# Patient Record
Sex: Male | Born: 1975 | Hispanic: No | Marital: Married | Smoking: Never smoker
Health system: Southern US, Community
[De-identification: ages and names within clinical notes are randomized; demographics above are authoritative.]

---
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
## 2004-03-12 HISTORY — PX: REFRACTIVE SURGERY: SHX103

## 2019-02-03 ENCOUNTER — Other Ambulatory Visit: Payer: Self-pay

## 2019-02-03 DIAGNOSIS — Z20822 Contact with and (suspected) exposure to covid-19: Secondary | ICD-10-CM

## 2019-02-04 LAB — NOVEL CORONAVIRUS, NAA: SARS-CoV-2, NAA: NOT DETECTED

## 2019-04-17 ENCOUNTER — Ambulatory Visit: Payer: BC Managed Care – PPO | Attending: Internal Medicine

## 2019-04-17 DIAGNOSIS — U071 COVID-19: Secondary | ICD-10-CM | POA: Insufficient documentation

## 2019-04-17 DIAGNOSIS — Z8616 Personal history of COVID-19: Secondary | ICD-10-CM

## 2019-04-17 DIAGNOSIS — Z20822 Contact with and (suspected) exposure to covid-19: Secondary | ICD-10-CM

## 2019-04-18 LAB — NOVEL CORONAVIRUS, NAA: SARS-CoV-2, NAA: DETECTED — AB

## 2019-07-12 ENCOUNTER — Encounter (HOSPITAL_COMMUNITY): Payer: Self-pay | Admitting: Emergency Medicine

## 2019-07-12 ENCOUNTER — Other Ambulatory Visit: Payer: Self-pay

## 2019-07-12 ENCOUNTER — Inpatient Hospital Stay (HOSPITAL_COMMUNITY)
Admission: EM | Admit: 2019-07-12 | Discharge: 2019-07-18 | DRG: 342 | Disposition: A | Discharge status: Home / Self Care | Payer: BC Managed Care – PPO | Attending: General Surgery | Admitting: General Surgery

## 2019-07-12 ENCOUNTER — Emergency Department (HOSPITAL_COMMUNITY): Payer: BC Managed Care – PPO

## 2019-07-12 DIAGNOSIS — M109 Gout, unspecified: Secondary | ICD-10-CM

## 2019-07-12 DIAGNOSIS — R3916 Straining to void: Secondary | ICD-10-CM | POA: Diagnosis present

## 2019-07-12 DIAGNOSIS — E669 Obesity, unspecified: Secondary | ICD-10-CM | POA: Diagnosis present

## 2019-07-12 DIAGNOSIS — E876 Hypokalemia: Secondary | ICD-10-CM | POA: Diagnosis present

## 2019-07-12 DIAGNOSIS — K352 Acute appendicitis with generalized peritonitis, without abscess: Principal | ICD-10-CM | POA: Diagnosis present

## 2019-07-12 DIAGNOSIS — Z6830 Body mass index (BMI) 30.0-30.9, adult: Secondary | ICD-10-CM

## 2019-07-12 DIAGNOSIS — F419 Anxiety disorder, unspecified: Secondary | ICD-10-CM | POA: Diagnosis present

## 2019-07-12 DIAGNOSIS — K625 Hemorrhage of anus and rectum: Secondary | ICD-10-CM | POA: Diagnosis present

## 2019-07-12 DIAGNOSIS — R300 Dysuria: Secondary | ICD-10-CM

## 2019-07-12 DIAGNOSIS — K3580 Unspecified acute appendicitis: Principal | ICD-10-CM

## 2019-07-12 DIAGNOSIS — K429 Umbilical hernia without obstruction or gangrene: Secondary | ICD-10-CM | POA: Diagnosis present

## 2019-07-12 DIAGNOSIS — N179 Acute kidney failure, unspecified: Secondary | ICD-10-CM | POA: Diagnosis present

## 2019-07-12 DIAGNOSIS — Z8616 Personal history of COVID-19: Secondary | ICD-10-CM

## 2019-07-12 DIAGNOSIS — D62 Acute posthemorrhagic anemia: Secondary | ICD-10-CM | POA: Diagnosis not present

## 2019-07-12 DIAGNOSIS — A0472 Enterocolitis due to Clostridium difficile, not specified as recurrent: Secondary | ICD-10-CM | POA: Diagnosis present

## 2019-07-12 DIAGNOSIS — R7989 Other specified abnormal findings of blood chemistry: Secondary | ICD-10-CM

## 2019-07-12 LAB — COMPREHENSIVE METABOLIC PANEL
ALT: 20 U/L (ref 0–44)
AST: 23 U/L (ref 15–41)
Albumin: 4.3 g/dL (ref 3.5–5.0)
Alkaline Phosphatase: 52 U/L (ref 38–126)
Anion gap: 7 (ref 5–15)
BUN: 12 mg/dL (ref 6–20)
CO2: 25 mmol/L (ref 22–32)
Calcium: 9.1 mg/dL (ref 8.9–10.3)
Chloride: 105 mmol/L (ref 98–111)
Creatinine, Ser: 1.44 mg/dL — ABNORMAL HIGH (ref 0.61–1.24)
GFR calc Af Amer: >60 mL/min (ref >60)
GFR calc non Af Amer: 59 mL/min — ABNORMAL LOW (ref >60)
Glucose, Bld: 118 mg/dL — ABNORMAL HIGH (ref 70–99)
Potassium: 3.3 mmol/L — ABNORMAL LOW (ref 3.5–5.1)
Sodium: 137 mmol/L (ref 135–145)
Total Bilirubin: 1 mg/dL (ref 0.3–1.2)
Total Protein: 7.4 g/dL (ref 6.5–8.1)

## 2019-07-12 LAB — CBC
HCT: 46.4 % (ref 39.0–52.0)
Hemoglobin: 15.8 g/dL (ref 13.0–17.0)
MCH: 29.3 pg (ref 26.0–34.0)
MCHC: 34.1 g/dL (ref 30.0–36.0)
MCV: 85.9 fL (ref 80.0–100.0)
Platelets: 290 10*3/uL (ref 150–400)
RBC: 5.4 MIL/uL (ref 4.22–5.81)
RDW: 12.8 % (ref 11.5–15.5)
WBC: 16.2 10*3/uL — ABNORMAL HIGH (ref 4.0–10.5)
nRBC: 0 % (ref 0.0–0.2)

## 2019-07-12 LAB — LIPASE, BLOOD: Lipase: 40 U/L (ref 11–51)

## 2019-07-12 LAB — POC OCCULT BLOOD, ED: Fecal Occult Bld: POSITIVE — AB

## 2019-07-12 MED ORDER — SODIUM CHLORIDE (PF) 0.9 % IJ SOLN
INTRAMUSCULAR | Status: AC
  Filled 2019-07-12: qty 50

## 2019-07-12 MED ORDER — ONDANSETRON HCL 4 MG/2ML IJ SOLN
4.0000 mg | Freq: Once | INTRAMUSCULAR | Status: AC
  Administered 2019-07-12: 4 mg via INTRAVENOUS
  Filled 2019-07-12: qty 2

## 2019-07-12 MED ORDER — MORPHINE SULFATE (PF) 4 MG/ML IV SOLN
4.0000 mg | Freq: Once | INTRAVENOUS | Status: AC
  Administered 2019-07-12: 4 mg via INTRAVENOUS
  Filled 2019-07-12: qty 1

## 2019-07-12 MED ORDER — SODIUM CHLORIDE 0.9 % IV SOLN: 2.0000 g | Freq: Once | INTRAVENOUS | Status: DC

## 2019-07-12 MED ORDER — SODIUM CHLORIDE 0.9% FLUSH
3.0000 mL | Freq: Once | INTRAVENOUS | Status: AC
  Administered 2019-07-12: 3 mL via INTRAVENOUS

## 2019-07-12 MED ORDER — IOHEXOL 300 MG/ML  SOLN
100.0000 mL | Freq: Once | INTRAMUSCULAR | Status: AC | PRN
  Administered 2019-07-12: 100 mL via INTRAVENOUS

## 2019-07-12 MED ORDER — METRONIDAZOLE IN NACL 5-0.79 MG/ML-% IV SOLN
500.0000 mg | Freq: Once | INTRAVENOUS | Status: DC
  Filled 2019-07-12: qty 100

## 2019-07-12 NOTE — ED Triage Notes (Signed)
Patient is complaining of penis pain from straining due to constipation. Patient is complaining of right groin pain and abdominal pain. All of this started around 2 am.

## 2019-07-12 NOTE — ED Provider Notes (Signed)
[facility]-EMERGENCY DEPT Provider Note   CSN: [id number] Arrival date & time: 07/12/19  2141     History Chief Complaint  Patient presents with  . Abdominal Pain  . Groin Pain  . Penis Pain    Andre Green is a 44 y.o. male who presents for evaluation of abdominal pain that began this morning at about 2 AM.  He states he woke up with diffuse abdominal pain/pressure.  He states that he tried to have a bowel movement but states that it did not help.  He reports that throughout the day, he continued to have pain.  He reported that he had some small episodes of nausea but did not actually vomit.  He was able to eat some small amount of rice.  Patient reports that as he has a day went through, he felt like his abdomen is getting more bloated and more painful.  He states he has not been passing any gas over the last day.  He states he tried to strain very hard and have a bowel movement and states that he was able to get a small amount of liquid stool out.  He reports that when he strained, he had a sharp pain that radiated to his bilateral testicles and penis.  He states that that pain is better now but he just feels pressure on his abdomen is pushing on his groin.  He has not taken any medications for the pain.  He denies any history of abdominal surgery.  No fevers, chest pain, difficulty breathing, dysuria, hematuria, testicular pain, penile discharge, penile swelling.Marland Kitchen  He also reports that he has been having rectal bleeding over the last month.  He has not sought evaluation for this.  He states it is bright red denies any history of black tarry stools.  The history is provided by the patient.       History reviewed. No pertinent past medical history.  There are no problems to display for this patient.   History reviewed. No pertinent surgical history.     History reviewed. No pertinent family history.  Social History   Tobacco Use  . Smoking status: Never  Smoker  . Smokeless tobacco: Never Used  Substance Use Topics  . Alcohol use: Never  . Drug use: Never    Home Medications Prior to Admission medications   Medication Sig Start Date End Date Taking? Authorizing Provider  ibuprofen (ADVIL) 200 MG tablet Take 200-600 mg by mouth every 6 (six) hours as needed for headache or moderate pain.   Yes [provider]    Allergies    Mixed grasses  Review of Systems   Review of Systems  Constitutional: Negative for fever.  Respiratory: Negative for cough and shortness of breath.   Cardiovascular: Negative for chest pain.  Gastrointestinal: Positive for abdominal pain, constipation and nausea. Negative for vomiting.  Genitourinary: Positive for penile pain. Negative for discharge, dysuria, hematuria, penile swelling, scrotal swelling and testicular pain.  Neurological: Negative for headaches.  All other systems reviewed and are negative.   Physical Exam Updated Vital Signs BP (!) 148/91 (BP Location: Left Arm)   Pulse 91   Temp 98 F (36.7 C) (Oral)   Resp 15   Ht 5\' 4"  (1.626 m)   Wt 80.7 kg   SpO2 95%   BMI 30.55 kg/m   Physical Exam Vitals and nursing note reviewed. Exam conducted with a chaperone present.  Constitutional:      Appearance: Normal appearance.  He is well-developed.  HENT:     Head: Normocephalic and atraumatic.  Eyes:     General: Lids are normal.     Conjunctiva/sclera: Conjunctivae normal.     Pupils: Pupils are equal, round, and reactive to light.  Cardiovascular:     Rate and Rhythm: Normal rate and regular rhythm.     Pulses: Normal pulses.     Heart sounds: Normal heart sounds. No murmur. No friction rub. No gallop.   Pulmonary:     Effort: Pulmonary effort is normal.     Breath sounds: Normal breath sounds.     Comments: Lungs clear to auscultation bilaterally.  Symmetric chest rise.  No wheezing, rales, rhonchi. Abdominal:     General: Bowel sounds are decreased.     Palpations:  Abdomen is soft. Abdomen is not rigid.     Tenderness: There is generalized abdominal tenderness. There is no right CVA tenderness, left CVA tenderness or guarding.     Comments: Abdomen is slightly distended but no rigidity or guarding.  Decreased bowel sounds noted throughout.  He has diffuse abdominal tenderness with no focal point.  No CVA tenderness.  Genitourinary:    Penis: Normal and circumcised.      Testes:        Right: Tenderness not present.        Left: Tenderness not present.     Rectum: Guaiac result positive.     Comments: The exam was performed with a chaperone present. Normal male genitalia. No evidence of rash, ulcers or lesions. No tenderness noted to bilateral testicles. No overlying warmth or erythema or edema. No tenderness noted to penis. Digital Rectal Exam reveals sphincter with good tone. No external hemorrhoids. No masses or fissures. No gross melena.  Musculoskeletal:        General: Normal range of motion.     Cervical back: Full passive range of motion without pain.  Skin:    General: Skin is warm and dry.     Capillary Refill: Capillary refill takes less than 2 seconds.  Neurological:     Mental Status: He is alert and oriented to person, place, and time.  Psychiatric:        Speech: Speech normal.     ED Results / Procedures / Treatments   Labs (all labs ordered are listed, but only abnormal results are displayed) Labs Reviewed  COMPREHENSIVE METABOLIC PANEL - Abnormal; Notable for the following components:      Result Value   Potassium 3.3 (*)    Glucose, Bld 118 (*)    Creatinine, Ser 1.44 (*)    GFR calc non Af Amer 59 (*)    All other components within normal limits  CBC - Abnormal; Notable for the following components:   WBC 16.2 (*)    All other components within normal limits  POC OCCULT BLOOD, ED - Abnormal; Notable for the following components:   Fecal Occult Bld POSITIVE (*)    All other components within normal limits  RESPIRATORY  PANEL BY RT PCR (FLU A&B, COVID)  LIPASE, BLOOD  URINALYSIS, ROUTINE W REFLEX MICROSCOPIC    EKG None  Radiology CT ABDOMEN PELVIS W CONTRAST  Result Date: 07/12/2019 CLINICAL DATA:  Right groin pain. EXAM: CT ABDOMEN AND PELVIS WITH CONTRAST TECHNIQUE: Multidetector CT imaging of the abdomen and pelvis was performed using the standard protocol following bolus administration of intravenous contrast. CONTRAST:  OMNIPAQUE IOHEXOL 300 MG/ML  SOLN COMPARISON:  None. FINDINGS: Lower chest:  The lung bases are clear. The heart size is normal. Hepatobiliary: The liver is normal. Normal gallbladder.There is no biliary ductal dilation. Pancreas: Normal contours without ductal dilatation. No peripancreatic fluid collection. Spleen: Unremarkable. Adrenals/Urinary Tract: --Adrenal glands: Unremarkable. --Right kidney/ureter: No hydronephrosis or radiopaque kidney stones. --Left kidney/ureter: No hydronephrosis or radiopaque kidney stones. --Urinary bladder: Unremarkable. Stomach/Bowel: --Stomach/Duodenum: No hiatal hernia or other gastric abnormality. Normal duodenal course and caliber. --Small bowel: Unremarkable. --Colon: Unremarkable. --Appendix: The appendix is located in the right lower quadrant and is dilated measuring up to approximately 1.4 cm (axial series 2, image 61). There are periappendiceal inflammatory changes. There is free fluid in the right lower quadrant. Vascular/Lymphatic: Normal course and caliber of the major abdominal vessels. --No retroperitoneal lymphadenopathy. --No mesenteric lymphadenopathy. --No pelvic or inguinal lymphadenopathy. Reproductive: Unremarkable Other: There is a small amount of free fluid in the patient's abdomen and pelvis. The abdominal wall is normal. Musculoskeletal. No acute displaced fractures. IMPRESSION: 1. Findings most consistent with acute uncomplicated appendicitis. 2. There is a small volume of reactive free fluid in the abdomen and pelvis. Electronically  Signed   By: Katherine Mantle M.D.   On: 07/12/2019 23:51    Procedures Procedures (including critical care time)  Medications Ordered in ED Medications  sodium chloride (PF) 0.9 % injection (has no administration in time range)  cefTRIAXone (ROCEPHIN) 2 g in sodium chloride 0.9 % 100 mL IVPB (has no administration in time range)    And  metroNIDAZOLE (FLAGYL) IVPB 500 mg (has no administration in time range)  sodium chloride flush (NS) 0.9 % injection 3 mL (3 mLs Intravenous Given 07/12/19 2308)  ondansetron (ZOFRAN) injection 4 mg (4 mg Intravenous Given 07/12/19 2307)  morphine 4 MG/ML injection 4 mg (4 mg Intravenous Given 07/12/19 2305)  iohexol (OMNIPAQUE) 300 MG/ML solution 100 mL (100 mLs Intravenous Contrast Given 07/12/19 2329)    ED Course  I have reviewed the triage vital signs and the nursing notes.  Pertinent labs & imaging results that were available during my care of the patient were reviewed by me and considered in my medical decision making (see chart for details).    MDM Rules/Calculators/A&P                      44 y.o. M who presents for evaluation of of abdominal pain that began at 2 am. Associated with nausea and constipation. No fevers. No vomiting. Patient is afebrile, non-toxic appearing, sitting comfortably on examination table. Vital signs reviewed and stable. On exam he has generalized abdominal tenderness with some abdominal distention. No CVA tenderness. GU exam shows no testicular abnormalities. No tenderness. Exam is not concerning for testicular torsion. Consider infectious etiology vs obstruction. Plan for labs and CT scan.   Fecal occult is positive. CMP shows 3.3 and BUN of 12 and Cr of 1.44. CBC shows leukocytosis of 16.2.  He is fecal occult positive but has stable hemoglobin. This has been an ongoing issue for several months. I suspect this is most likely due to internal hemorrhoids. History/physical exam is not concerning for GI bleed.  CT scan  shows evidence of appendicitis.  The appendix is dilated measuring 1.4 cm.  There is some periappendiceal inflammatory changes.   Updated patient on results. We will plan to start patient on antibiotics. Patient with no known drug allergies.  Will consult surgery.   Discussed patient with Dr. Michaell Cowing (Gen Surg). Will come evaluate patient in the ED.   Portions of  this note were generated with Lobby[employer]. Dictation errors may occur despite best attempts at proofreading.    Final Clinical Impression(s) / ED Diagnoses Final diagnoses:  Acute appendicitis, unspecified acute appendicitis type    Rx / DC Orders ED Discharge Orders    None       Desma Mcgregor 07/13/19 Ihor Dow, MD 07/13/19 1819

## 2019-07-13 ENCOUNTER — Inpatient Hospital Stay (HOSPITAL_COMMUNITY): Payer: BC Managed Care – PPO | Admitting: Anesthesiology

## 2019-07-13 ENCOUNTER — Encounter (HOSPITAL_COMMUNITY): Payer: Self-pay | Admitting: Surgery

## 2019-07-13 ENCOUNTER — Encounter (HOSPITAL_COMMUNITY): Admission: EM | Disposition: A | Discharge status: Home / Self Care | Payer: Self-pay | Source: Home / Self Care

## 2019-07-13 DIAGNOSIS — A0472 Enterocolitis due to Clostridium difficile, not specified as recurrent: Secondary | ICD-10-CM | POA: Diagnosis present

## 2019-07-13 DIAGNOSIS — R300 Dysuria: Secondary | ICD-10-CM

## 2019-07-13 DIAGNOSIS — E669 Obesity, unspecified: Secondary | ICD-10-CM | POA: Diagnosis present

## 2019-07-13 DIAGNOSIS — K3580 Unspecified acute appendicitis: Secondary | ICD-10-CM

## 2019-07-13 DIAGNOSIS — Z8616 Personal history of COVID-19: Secondary | ICD-10-CM | POA: Diagnosis not present

## 2019-07-13 DIAGNOSIS — F419 Anxiety disorder, unspecified: Secondary | ICD-10-CM | POA: Diagnosis present

## 2019-07-13 DIAGNOSIS — N179 Acute kidney failure, unspecified: Secondary | ICD-10-CM | POA: Diagnosis present

## 2019-07-13 DIAGNOSIS — K429 Umbilical hernia without obstruction or gangrene: Secondary | ICD-10-CM | POA: Diagnosis present

## 2019-07-13 DIAGNOSIS — IMO0002 Reserved for concepts with insufficient information to code with codable children: Secondary | ICD-10-CM

## 2019-07-13 DIAGNOSIS — R3916 Straining to void: Secondary | ICD-10-CM | POA: Diagnosis present

## 2019-07-13 DIAGNOSIS — E876 Hypokalemia: Secondary | ICD-10-CM

## 2019-07-13 DIAGNOSIS — Z6830 Body mass index (BMI) 30.0-30.9, adult: Secondary | ICD-10-CM | POA: Diagnosis not present

## 2019-07-13 DIAGNOSIS — K625 Hemorrhage of anus and rectum: Secondary | ICD-10-CM | POA: Diagnosis present

## 2019-07-13 DIAGNOSIS — R7989 Other specified abnormal findings of blood chemistry: Secondary | ICD-10-CM

## 2019-07-13 DIAGNOSIS — M109 Gout, unspecified: Secondary | ICD-10-CM

## 2019-07-13 DIAGNOSIS — D62 Acute posthemorrhagic anemia: Secondary | ICD-10-CM | POA: Diagnosis not present

## 2019-07-13 DIAGNOSIS — K352 Acute appendicitis with generalized peritonitis, without abscess: Secondary | ICD-10-CM | POA: Diagnosis present

## 2019-07-13 HISTORY — DX: Gout, unspecified: M10.9

## 2019-07-13 HISTORY — PX: LAPAROSCOPIC APPENDECTOMY: SHX408

## 2019-07-13 HISTORY — DX: Anxiety disorder, unspecified: F41.9

## 2019-07-13 LAB — URINALYSIS, ROUTINE W REFLEX MICROSCOPIC
Bilirubin Urine: NEGATIVE
Glucose, UA: NEGATIVE mg/dL
Ketones, ur: NEGATIVE mg/dL
Leukocytes,Ua: NEGATIVE
Nitrite: NEGATIVE
Protein, ur: 30 mg/dL — AB
Specific Gravity, Urine: 1.04 — ABNORMAL HIGH (ref 1.005–1.030)
pH: 6 (ref 5.0–8.0)

## 2019-07-13 LAB — MAGNESIUM: Magnesium: 2.2 mg/dL (ref 1.7–2.4)

## 2019-07-13 LAB — RESPIRATORY PANEL BY RT PCR (FLU A&B, COVID)
Influenza A by PCR: NEGATIVE
Influenza B by PCR: NEGATIVE
SARS Coronavirus 2 by RT PCR: NEGATIVE

## 2019-07-13 LAB — SURGICAL PCR SCREEN
MRSA, PCR: NEGATIVE
Staphylococcus aureus: NEGATIVE

## 2019-07-13 LAB — HIV ANTIBODY (ROUTINE TESTING W REFLEX): HIV Screen 4th Generation wRfx: NONREACTIVE

## 2019-07-13 SURGERY — APPENDECTOMY, LAPAROSCOPIC
Anesthesia: General | Site: Abdomen

## 2019-07-13 MED ORDER — MIDAZOLAM HCL 2 MG/2ML IJ SOLN
INTRAMUSCULAR | Status: AC
  Filled 2019-07-13: qty 2

## 2019-07-13 MED ORDER — BUPIVACAINE-EPINEPHRINE (PF) 0.5% -1:200000 IJ SOLN
INTRAMUSCULAR | Status: DC | PRN
  Administered 2019-07-13: 10 mL

## 2019-07-13 MED ORDER — LIDOCAINE 2% (20 MG/ML) 5 ML SYRINGE
INTRAMUSCULAR | Status: DC | PRN
  Administered 2019-07-13: 60 mg via INTRAVENOUS

## 2019-07-13 MED ORDER — SODIUM CHLORIDE 0.9 % IV SOLN: Freq: Three times a day (TID) | INTRAVENOUS | Status: DC | PRN

## 2019-07-13 MED ORDER — METRONIDAZOLE IN NACL 5-0.79 MG/ML-% IV SOLN
500.0000 mg | Freq: Three times a day (TID) | INTRAVENOUS | Status: DC
Start: 2019-07-13 — End: 2019-07-13

## 2019-07-13 MED ORDER — LACTATED RINGERS IR SOLN
Status: DC | PRN
  Administered 2019-07-13: 3000 mL

## 2019-07-13 MED ORDER — LACTATED RINGERS IV SOLN: INTRAVENOUS | Status: DC

## 2019-07-13 MED ORDER — LACTATED RINGERS IV SOLN: INTRAVENOUS | Status: DC | PRN

## 2019-07-13 MED ORDER — GABAPENTIN 300 MG PO CAPS
300.0000 mg | ORAL_CAPSULE | ORAL | Status: DC
  Filled 2019-07-13: qty 1

## 2019-07-13 MED ORDER — 0.9 % SODIUM CHLORIDE (POUR BTL) OPTIME
TOPICAL | Status: DC | PRN
  Administered 2019-07-13: 1000 mL

## 2019-07-13 MED ORDER — FENTANYL CITRATE (PF) 100 MCG/2ML IJ SOLN
INTRAMUSCULAR | Status: DC | PRN
  Administered 2019-07-13: 100 ug via INTRAVENOUS

## 2019-07-13 MED ORDER — LACTATED RINGERS IV BOLUS: 1000.0000 mL | Freq: Three times a day (TID) | INTRAVENOUS | Status: DC | PRN

## 2019-07-13 MED ORDER — SIMETHICONE 40 MG/0.6ML PO SUSP
40.0000 mg | Freq: Four times a day (QID) | ORAL | Status: DC | PRN
  Administered 2019-07-16 – 2019-07-17 (×3): 40 mg via ORAL
  Filled 2019-07-13 (×8): qty 0.6

## 2019-07-13 MED ORDER — MIDAZOLAM HCL 5 MG/5ML IJ SOLN
INTRAMUSCULAR | Status: DC | PRN
  Administered 2019-07-13: 2 mg via INTRAVENOUS

## 2019-07-13 MED ORDER — ACETAMINOPHEN 325 MG PO TABS: 325.0000 mg | ORAL_TABLET | Freq: Four times a day (QID) | ORAL | Status: DC | PRN

## 2019-07-13 MED ORDER — LACTATED RINGERS IV BOLUS
1000.0000 mL | Freq: Once | INTRAVENOUS | Status: AC
  Administered 2019-07-13: 1000 mL via INTRAVENOUS

## 2019-07-13 MED ORDER — FENTANYL CITRATE (PF) 100 MCG/2ML IJ SOLN: 25.0000 ug | INTRAMUSCULAR | Status: DC | PRN

## 2019-07-13 MED ORDER — DEXAMETHASONE SODIUM PHOSPHATE 4 MG/ML IJ SOLN
INTRAMUSCULAR | Status: DC | PRN
  Administered 2019-07-13: 10 mg via INTRAVENOUS

## 2019-07-13 MED ORDER — ONDANSETRON HCL 4 MG/2ML IJ SOLN: 4.0000 mg | Freq: Once | INTRAMUSCULAR | Status: DC | PRN

## 2019-07-13 MED ORDER — ACETAMINOPHEN 500 MG PO TABS
1000.0000 mg | ORAL_TABLET | ORAL | Status: DC
  Filled 2019-07-13: qty 2

## 2019-07-13 MED ORDER — ONDANSETRON HCL 4 MG/2ML IJ SOLN
4.0000 mg | Freq: Four times a day (QID) | INTRAMUSCULAR | Status: DC | PRN
  Administered 2019-07-13: 4 mg via INTRAVENOUS

## 2019-07-13 MED ORDER — METHOCARBAMOL 1000 MG/10ML IJ SOLN
1000.0000 mg | Freq: Four times a day (QID) | INTRAVENOUS | Status: DC | PRN
  Filled 2019-07-13: qty 10

## 2019-07-13 MED ORDER — POTASSIUM CHLORIDE 10 MEQ/100ML IV SOLN
10.0000 meq | INTRAVENOUS | Status: AC
  Administered 2019-07-13 (×4): 10 meq via INTRAVENOUS
  Filled 2019-07-13 (×4): qty 100

## 2019-07-13 MED ORDER — PROPOFOL 10 MG/ML IV BOLUS
INTRAVENOUS | Status: DC | PRN
  Administered 2019-07-13: 150 mg via INTRAVENOUS

## 2019-07-13 MED ORDER — SODIUM CHLORIDE 0.9 % IV SOLN
8.0000 mg | Freq: Four times a day (QID) | INTRAVENOUS | Status: DC | PRN
  Filled 2019-07-13: qty 4

## 2019-07-13 MED ORDER — CELECOXIB 200 MG PO CAPS
200.0000 mg | ORAL_CAPSULE | ORAL | Status: DC
  Filled 2019-07-13: qty 1

## 2019-07-13 MED ORDER — CEFTRIAXONE SODIUM 2 G IJ SOLR: 2.0000 g | INTRAMUSCULAR | Status: DC

## 2019-07-13 MED ORDER — ACETAMINOPHEN 325 MG PO TABS
650.0000 mg | ORAL_TABLET | Freq: Four times a day (QID) | ORAL | Status: DC
  Administered 2019-07-13 – 2019-07-18 (×19): 650 mg via ORAL
  Filled 2019-07-13 (×21): qty 2

## 2019-07-13 MED ORDER — DIPHENHYDRAMINE HCL 50 MG/ML IJ SOLN: 12.5000 mg | Freq: Four times a day (QID) | INTRAMUSCULAR | Status: DC | PRN

## 2019-07-13 MED ORDER — MAGIC MOUTHWASH
15.0000 mL | Freq: Four times a day (QID) | ORAL | Status: DC | PRN
  Filled 2019-07-13: qty 15

## 2019-07-13 MED ORDER — HYDROMORPHONE HCL 1 MG/ML IJ SOLN
0.5000 mg | INTRAMUSCULAR | Status: DC | PRN
  Administered 2019-07-13 (×2): 1 mg via INTRAVENOUS
  Administered 2019-07-13: 2 mg via INTRAVENOUS
  Administered 2019-07-13: 1 mg via INTRAVENOUS
  Administered 2019-07-14: 0.5 mg via INTRAVENOUS
  Administered 2019-07-14 – 2019-07-15 (×2): 1 mg via INTRAVENOUS
  Filled 2019-07-13 (×4): qty 1
  Filled 2019-07-13: qty 2
  Filled 2019-07-13 (×3): qty 1

## 2019-07-13 MED ORDER — FENTANYL CITRATE (PF) 100 MCG/2ML IJ SOLN
50.0000 ug | Freq: Once | INTRAMUSCULAR | Status: AC
  Administered 2019-07-13: 50 ug via INTRAVENOUS
  Filled 2019-07-13: qty 2

## 2019-07-13 MED ORDER — FENTANYL CITRATE (PF) 250 MCG/5ML IJ SOLN
INTRAMUSCULAR | Status: AC
  Filled 2019-07-13: qty 5

## 2019-07-13 MED ORDER — PROCHLORPERAZINE EDISYLATE 10 MG/2ML IJ SOLN: 5.0000 mg | INTRAMUSCULAR | Status: DC | PRN

## 2019-07-13 MED ORDER — ONDANSETRON HCL 4 MG/2ML IJ SOLN
INTRAMUSCULAR | Status: AC
  Filled 2019-07-13: qty 2

## 2019-07-13 MED ORDER — CHLORHEXIDINE GLUCONATE CLOTH 2 % EX PADS
6.0000 | MEDICATED_PAD | Freq: Once | CUTANEOUS | Status: AC
  Administered 2019-07-13: 6 via TOPICAL

## 2019-07-13 MED ORDER — ENOXAPARIN SODIUM 40 MG/0.4ML ~~LOC~~ SOLN
40.0000 mg | Freq: Every day | SUBCUTANEOUS | Status: DC
  Administered 2019-07-13 – 2019-07-17 (×5): 40 mg via SUBCUTANEOUS
  Filled 2019-07-13 (×5): qty 0.4

## 2019-07-13 MED ORDER — SUGAMMADEX SODIUM 200 MG/2ML IV SOLN
INTRAVENOUS | Status: DC | PRN
  Administered 2019-07-13: 200 mg via INTRAVENOUS

## 2019-07-13 MED ORDER — KETOROLAC TROMETHAMINE 30 MG/ML IJ SOLN: 30.0000 mg | Freq: Once | INTRAMUSCULAR | Status: DC | PRN

## 2019-07-13 MED ORDER — BISACODYL 10 MG RE SUPP: 10.0000 mg | Freq: Every day | RECTAL | Status: DC | PRN

## 2019-07-13 MED ORDER — ROCURONIUM BROMIDE 10 MG/ML (PF) SYRINGE
PREFILLED_SYRINGE | INTRAVENOUS | Status: AC
  Filled 2019-07-13: qty 10

## 2019-07-13 MED ORDER — KETOROLAC TROMETHAMINE 15 MG/ML IJ SOLN
15.0000 mg | Freq: Four times a day (QID) | INTRAMUSCULAR | Status: DC | PRN
  Administered 2019-07-13: 15 mg via INTRAVENOUS
  Filled 2019-07-13 (×2): qty 1

## 2019-07-13 MED ORDER — METOPROLOL TARTRATE 5 MG/5ML IV SOLN: 5.0000 mg | Freq: Four times a day (QID) | INTRAVENOUS | Status: DC | PRN

## 2019-07-13 MED ORDER — LIDOCAINE 2% (20 MG/ML) 5 ML SYRINGE
INTRAMUSCULAR | Status: AC
  Filled 2019-07-13: qty 5

## 2019-07-13 MED ORDER — DOCUSATE SODIUM 100 MG PO CAPS
100.0000 mg | ORAL_CAPSULE | Freq: Two times a day (BID) | ORAL | Status: DC
  Administered 2019-07-13: 100 mg via ORAL
  Filled 2019-07-13 (×6): qty 1

## 2019-07-13 MED ORDER — DIPHENHYDRAMINE HCL 12.5 MG/5ML PO ELIX: 12.5000 mg | ORAL_SOLUTION | Freq: Four times a day (QID) | ORAL | Status: DC | PRN

## 2019-07-13 MED ORDER — ACETAMINOPHEN 650 MG RE SUPP: 650.0000 mg | Freq: Four times a day (QID) | RECTAL | Status: DC | PRN

## 2019-07-13 MED ORDER — LIP MEDEX EX OINT
1.0000 "application " | TOPICAL_OINTMENT | Freq: Two times a day (BID) | CUTANEOUS | Status: DC
  Administered 2019-07-13 – 2019-07-17 (×8): 1 via TOPICAL
  Filled 2019-07-13 (×2): qty 7

## 2019-07-13 MED ORDER — LORAZEPAM 2 MG/ML IJ SOLN
0.5000 mg | Freq: Three times a day (TID) | INTRAMUSCULAR | Status: DC | PRN
  Filled 2019-07-13: qty 1

## 2019-07-13 MED ORDER — DEXAMETHASONE SODIUM PHOSPHATE 10 MG/ML IJ SOLN
INTRAMUSCULAR | Status: AC
  Filled 2019-07-13: qty 1

## 2019-07-13 MED ORDER — CHLORHEXIDINE GLUCONATE CLOTH 2 % EX PADS: 6.0000 | MEDICATED_PAD | Freq: Once | CUTANEOUS | Status: DC

## 2019-07-13 MED ORDER — METHOCARBAMOL 1000 MG/10ML IJ SOLN
500.0000 mg | Freq: Four times a day (QID) | INTRAVENOUS | Status: DC | PRN
  Filled 2019-07-13: qty 5

## 2019-07-13 MED ORDER — PIPERACILLIN-TAZOBACTAM 3.375 G IVPB
3.3750 g | Freq: Three times a day (TID) | INTRAVENOUS | Status: DC
  Administered 2019-07-13 – 2019-07-15 (×8): 3.375 g via INTRAVENOUS
  Filled 2019-07-13 (×8): qty 50

## 2019-07-13 MED ORDER — BUPIVACAINE-EPINEPHRINE (PF) 0.5% -1:200000 IJ SOLN
INTRAMUSCULAR | Status: AC
  Filled 2019-07-13: qty 30

## 2019-07-13 MED ORDER — SUCCINYLCHOLINE CHLORIDE 20 MG/ML IJ SOLN
INTRAMUSCULAR | Status: DC | PRN
  Administered 2019-07-13: 120 mg via INTRAVENOUS

## 2019-07-13 MED ORDER — MEPERIDINE HCL 50 MG/ML IJ SOLN: 6.2500 mg | INTRAMUSCULAR | Status: DC | PRN

## 2019-07-13 MED ORDER — ROCURONIUM BROMIDE 10 MG/ML (PF) SYRINGE
PREFILLED_SYRINGE | INTRAVENOUS | Status: DC | PRN
Start: 2019-07-13 — End: 2019-07-13
  Administered 2019-07-13: 50 mg via INTRAVENOUS

## 2019-07-13 SURGICAL SUPPLY — 31 items
APPLIER CLIP ROT 10 11.4 M/L (STAPLE)
CLIP APPLIE ROT 10 11.4 M/L (STAPLE) IMPLANT
COVER WAND RF STERILE (DRAPES) IMPLANT
CUTTER FLEX LINEAR 45M (STAPLE) ×2 IMPLANT
DERMABOND ADVANCED (GAUZE/BANDAGES/DRESSINGS) ×2
DERMABOND ADVANCED .7 DNX12 (GAUZE/BANDAGES/DRESSINGS) ×1 IMPLANT
ELECT REM PT RETURN 15FT ADLT (MISCELLANEOUS) ×3 IMPLANT
ENDOLOOP SUT PDS II  0 18 (SUTURE)
ENDOLOOP SUT PDS II 0 18 (SUTURE) IMPLANT
GLOVE BIOGEL PI IND STRL 7.0 (GLOVE) ×1 IMPLANT
GLOVE BIOGEL PI INDICATOR 7.0 (GLOVE) ×2
GLOVE INDICATOR 8.0 STRL GRN (GLOVE) ×6 IMPLANT
GLOVE SS BIOGEL STRL SZ 7.5 (GLOVE) ×1 IMPLANT
GLOVE SUPERSENSE BIOGEL SZ 7.5 (GLOVE) ×2
GOWN STRL REUS W/TWL LRG LVL3 (GOWN DISPOSABLE) ×3 IMPLANT
GOWN STRL REUS W/TWL XL LVL3 (GOWN DISPOSABLE) ×6 IMPLANT
KIT BASIN (CUSTOM PROCEDURE TRAY) ×3 IMPLANT
KIT TURNOVER KIT A (KITS) ×2 IMPLANT
PENCIL SMOKE EVACUATOR (MISCELLANEOUS) IMPLANT
POUCH SPECIMEN RETRIEVAL 10MM (ENDOMECHANICALS) ×3 IMPLANT
RELOAD 45 VASCULAR/THIN (ENDOMECHANICALS) IMPLANT
RELOAD STAPLE 45 2.5 WHT GRN (ENDOMECHANICALS) IMPLANT
RELOAD STAPLE 45 3.5 BLU ETS (ENDOMECHANICALS) IMPLANT
RELOAD STAPLE TA45 3.5 REG BLU (ENDOMECHANICALS) ×3 IMPLANT
SET IRRIG TUBING LAPAROSCOPIC (IRRIGATION / IRRIGATOR) ×3 IMPLANT
SET TUBE SMOKE EVAC HIGH FLOW (TUBING) ×3 IMPLANT
SHEARS HARMONIC ACE PLUS 36CM (ENDOMECHANICALS) ×2 IMPLANT
SUT MNCRL AB 4-0 PS2 18 (SUTURE) ×3 IMPLANT
TOWEL OR 17X26 10 PK STRL BLUE (TOWEL DISPOSABLE) ×3 IMPLANT
TRAY LAPAROSCOPIC (CUSTOM PROCEDURE TRAY) ×3 IMPLANT
TROCAR XCEL 12X100 BLDLESS (ENDOMECHANICALS) ×3 IMPLANT

## 2019-07-13 NOTE — Anesthesia Postprocedure Evaluation (Signed)
Anesthesia Post Note  Patient: Andre Green  Procedure(s) Performed: APPENDECTOMY LAPAROSCOPIC (N/A Abdomen)     Patient location during evaluation: PACU Anesthesia Type: General Level of consciousness: awake and sedated Pain management: pain level controlled Vital Signs Assessment: post-procedure vital signs reviewed and stable Respiratory status: spontaneous breathing Cardiovascular status: stable Postop Assessment: no apparent nausea or vomiting Anesthetic complications: no    Last Vitals:  Vitals:   07/13/19 1245 07/13/19 1300  BP: 112/70 101/71  Pulse: 94 85  Resp: 18 17  Temp:  37.2 C  SpO2: 100% 100%    Last Pain:  Vitals:   07/13/19 1245  TempSrc:   PainSc: 4    Pain Goal: Patients Stated Pain Goal: 3 (07/13/19 1054)                 Caren Macadam

## 2019-07-13 NOTE — Op Note (Signed)
Appendectomy, Lap, Procedure Note  Indications: The patient presented with a history of right-sided abdominal pain. A CT  revealed findings consistent with acute appendicitis.  There was the concern for perforation which I discussed with the patient The procedure has been discussed with the patient.  Alternative therapies have been discussed with the patient.  Operative risks include bleeding,  Infection,  Organ injury,  Nerve injury,ABSCESS   Blood vessel injury,  DVT,  Pulmonary embolism,  Death,  And possible reoperation.  Medical management risks include worsening of present situation.  The success of the procedure is 50 -90 % at treating patients symptoms.  The patient understands and agrees to proceed.  Pre-operative Diagnosis: Acute appendicitis with generalized peritonitis  Post-operative Diagnosis: Same  Surgeon: Dortha Schwalbe  MD   Assistants: none  Anesthesia: General endotracheal anesthesia and Local anesthesia 0.25.% bupivacaine  ASA Class: 2  Procedure Details  The patient was seen again in the Holding Room. The risks, benefits, complications, treatment options, and expected outcomes were discussed with the patient and/or family. The possibilities of reaction to medication, pulmonary aspiration, perforation of viscus, bleeding, recurrent infection, finding a normal appendix, the need for additional procedures, failure to diagnose a condition, and creating a complication requiring transfusion or operation were discussed. There was concurrence with the proposed plan and informed consent was obtained. The site of surgery was properly noted/marked. The patient was taken to Operating Room, identified as Andre Green and the procedure verified as Appendectomy. A Time Out was held and the above information confirmed.  The patient was placed in the supine position and general anesthesia was induced, along with placement of orogastric tube, Venodyne boots, and a Foley catheter. The  abdomen was prepped and draped in a sterile fashion. A one centimeter infraumbilical incision was made and the peritoneal cavity was accessed via umbilical hernia  using the OPEN  technique. The pneumoperitoneum was then established to steady pressure of 12 mmHg. A 12 mm port was placed through the umbilical incision. Additional 5 mm cannulas then placed in the left lower quadrant of the abdomen and half way between the umbilicus and pubic symphysis under direct vision. A careful evaluation of the entire abdomen was carried out. The patient was placed in Trendelenburg and left lateral decubitus position. The small intestines were retracted in the cephalad and left lateral direction away from the pelvis and right lower quadrant. The patient was found to have an enlarged and inflamed appendix that was extending into the pelvis. There was no evidence of perforation.  The appendix was carefully dissected. It was perforated with significant pus but no abscess. 2 L of saline used for irrigation.   A window was made in the mesoappendix at the base of the appendix. A harmonic scalpel was used across the mesoappendix. The appendix was divided at its base using an endo-GIA stapler. Minimal appendiceal stump was left in place. There was no evidence of bleeding, leakage, or complication after division of the appendix. Irrigation was also performed and irrigate suctioned from the abdomen as well.  The umbilical port site was closed using 0 vicryl pursestring sutures fashion at the level of the fascia. The trocar site skin wounds were closed using skin staples.  Instrument, sponge, and needle counts were correct at the conclusion of the case.   Findings: The appendix was found to be inflamed. There were signs of necrosis.  There was perforation. There was not abscess formation.  Estimated Blood Loss:  less than 50 mL  Drains: none         Total IV Fluids: Per record         Specimens: appendix          Complications:  None; patient tolerated the procedure well.         Disposition: PACU - hemodynamically stable.         Condition: stable

## 2019-07-13 NOTE — Anesthesia Preprocedure Evaluation (Addendum)
Anesthesia Evaluation  Patient identified by MRN, date of birth, ID band Patient awake    Reviewed: Allergy & Precautions, NPO status , Patient's Chart, lab work & pertinent test results  Airway Mallampati: II       Dental no notable dental hx. (+) Teeth Intact   Pulmonary neg pulmonary ROS,    Pulmonary exam normal breath sounds clear to auscultation       Cardiovascular negative cardio ROS Normal cardiovascular exam Rhythm:Regular Rate:Normal     Neuro/Psych negative neurological ROS     GI/Hepatic negative GI ROS, Neg liver ROS,   Endo/Other  negative endocrine ROS  Renal/GU negative Renal ROS  negative genitourinary   Musculoskeletal negative musculoskeletal ROS (+)   Abdominal Normal abdominal exam  (+)   Peds  Hematology negative hematology ROS (+)   Anesthesia Other Findings   Reproductive/Obstetrics                             Anesthesia Physical Anesthesia Plan  ASA: I  Anesthesia Plan: General   Post-op Pain Management:    Induction:   PONV Risk Score and Plan: 3 and Ondansetron, Dexamethasone and Midazolam  Airway Management Planned: Oral ETT  Additional Equipment: None  Intra-op Plan:   Post-operative Plan: Extubation in OR  Informed Consent: I have reviewed the patients History and Physical, chart, labs and discussed the procedure including the risks, benefits and alternatives for the proposed anesthesia with the patient or authorized representative who has indicated his/her understanding and acceptance.     Dental advisory given  Plan Discussed with: CRNA  Anesthesia Plan Comments:         Anesthesia Quick Evaluation

## 2019-07-13 NOTE — H&P (Addendum)
Andre Green  02-08-1976 [id number]  CARE TEAM:  PCP: Patient, No Pcp Per  Outpatient Care Team: Patient Care Team: Patient, No Pcp Per as PCP - General (General Practice)  Inpatient Treatment Team: Treatment Team: Attending Provider: Glynn Octave, MD; Registered Nurse: Dellis Filbert, RN; Physician Assistant: Maxwell Caul, PA-C; Technician: Maceo Pro, EMT; Consulting Physician: Montez Morita, Md, MD   This patient is a 44 y.o.male who presents today for surgical evaluation at the request of Graciella Freer, PA-C.   Chief complaint / Reason for evaluation: Abdominal pain.  Dysuria.  Probable appendicitis  Gentleman mostly healthy.  Some anxiety.  Woke up last night with abdominal pain and discomfort.  Difficult to strain to urinate.  Work gradually worsened.  Pain radiating down to his penis.  More right than left-sided.  Wondered if he had a kidney stone.  Called his sister-in-law who is a Development worker, community in [place].  Worsening pain.  Finally decided come the emergency room given worsening concerns and getting chills/rigors.  Examination more diffuse.  Mildly elevated creatinine.  Elevated white count.  CT scan shows inflamed appendix with periappendiceal fluid tracking on right side of abdomen and bladder.  Suspicious for appendicitis.  Surgical consultation requested.  No personal nor family history of GI/colon cancer, inflammatory bowel disease, allergy such as Celiac Sprue, dietary/dairy problems, colitis, ulcers nor gastritis.  No recent sick contacts/gastroenteritis.  No travel outside the country.  No changes in diet.  No dysphagia to solids or liquids.  No significant heartburn or reflux.  No hematochezia, hematemesis, coffee ground emesis.  No evidence of prior gastric/peptic ulceration.  Occasionally has irregular bowels a few times a year but has not been formally diagnosed with irritable bowel.  No abdominal surgeries.  Definitely difficulty with urinating with burning  sensation and discomfort at tip of penis but no history of UTIs.    Assessment  Andre Green  44 y.o. male       Problem List:  Principal Problem:   Suppurative appendicitis Active Problems:   Hypokalemia   Creatinine elevation   Anxiety   Obesity (BMI 30-39.9)   Gout   Difficult or painful urination   History of COVID-19   Probable appendicitis with reactive fluid suspicious for acute separative appendicitis.  No definite discrete abscess or major perforation  Plan:  Admission   Nausea and pain control  IV antibiotics.  Zosyn given complexity.  Rehydration with elevated creatinine.  Correct low potassium/hypokalemia.  Check magnesium.  Most likely diagnostic laparoscopy and appendectomy this admission in the morning.  The anatomy & physiology of the digestive tract was discussed.  The pathophysiology of appendicitis and other appendiceal disorders were discussed.  Natural history risks without surgery was discussed.   I feel the risks of no intervention will lead to serious problems that outweigh the operative risks; therefore, I recommended diagnostic laparoscopy with removal of appendix to remove the pathology.  Laparoscopic & open techniques were discussed.   I noted a good likelihood this will help address the problem.   Risks such as bleeding, infection, abscess, leak, reoperation, injury to other organs, need for repair of tissues / organs, possible ostomy, hernia, heart attack, stroke, death, and other risks were discussed.  Goals of post-operative recovery were discussed as well.  We will work to minimize complications.  Questions were answered.  The patient expresses understanding & wishes to proceed with surgery.  Possible small umbilical hernia.  Could be addressed at the time of surgery  or avoid.  Not an emergency.  History of gout but no active flare.  Not on maintenance colchicine at this time.  Hold off any intervention.  Penile pain and dysuria most  likely referred pain from acute suppurative appendicitis with purulent ascites going down onto the bladder.  Doubt UTI.  Would treat with appendectomy and regroup  -VTE prophylaxis- SCDs, etc -mobilize as tolerated to help recovery  45 minutes spent in review, evaluation, examination, counseling, and coordination of care.  More than 50% of that time was spent in counseling.  Ardeth SportsmanSteven C. Twilla Khouri, MD, FACS, MASCRS Gastrointestinal and Minimally Invasive Surgery  [facility] [address] [place], [place] [address] [phone] Main / Paging [phone] Fax  [phone] for weekday/non holidays Check [email or web address] for coverage night/weekend/holidays Please Do not use SecureChat as it is not reliable for patient care on the surgical service.      07/13/2019      Past Medical History:  Diagnosis Date  . Anxiety 07/13/2019  . Gout 07/13/2019    Past Surgical History:  Procedure Laterality Date  . 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 REFRACTIVE SURGERY  2006    Social History   Socioeconomic History  . Marital status: Married    Spouse name: Not on file  . Number of children: Not on file  . Years of education: Not on file  . Highest education level: Not on file  Occupational History  . Not on file  Tobacco Use  . Smoking status: Never Smoker  . Smokeless tobacco: Never Used  Substance and Sexual Activity  . Alcohol use: Never  . Drug use: Never  . Sexual activity: Not on file  Other Topics Concern  . Not on file  Social History Narrative  . Not on file   Social Determinants of Health   Financial Resource Strain:   . Difficulty of Paying Living Expenses:   Food Insecurity:   . Worried About Programme researcher, broadcasting/film/videounning Out of Food in the Last Year:   . Baristaan Out of Food in the Last Year:   Transportation Needs:   . Freight forwarderLack of Transportation (Medical):   Marland Kitchen. Lack of Transportation (Non-Medical):   Physical Activity:   . Days of Exercise per Week:   .  Minutes of Exercise per Session:   Stress:   . Feeling of Stress :   Social Connections:   . Frequency of Communication with Friends and Family:   . Frequency of Social Gatherings with Friends and Family:   . Attends Religious Services:   . Active Member of Clubs or Organizations:   . Attends BankerClub or Organization Meetings:   Marland Kitchen. Marital Status:   Intimate Partner Violence:   . Fear of Current or Ex-Partner:   . Emotionally Abused:   Marland Kitchen. Physically Abused:   . Sexually Abused:     History reviewed. No pertinent family history.  Current Facility-Administered Medications  Medication Dose Route Frequency Provider Last Rate Last Admin  . cefTRIAXone (ROCEPHIN) 2 g in sodium chloride 0.9 % 100 mL IVPB  2 g Intravenous Once Graciella FreerLayden, Lindsey A, PA-C       And  . metroNIDAZOLE (FLAGYL) IVPB 500 mg  500 mg Intravenous Once Graciella FreerLayden, Lindsey A, PA-C      . fentaNYL (SUBLIMAZE) injection 50 mcg  50 mcg Intravenous Once Graciella FreerLayden, Lindsey A, PA-C      . sodium chloride (PF) 0.9 % injection  Current Outpatient Medications  Medication Sig Dispense Refill  . ibuprofen (ADVIL) 200 MG tablet Take 200-600 mg by mouth every 6 (six) hours as needed for headache or moderate pain.       Allergies  Allergen Reactions  . Mixed Grasses Anaphylaxis    ROS:   All other systems reviewed & are negative except per HPI or as noted below: Constitutional:  +chills, rigors.  Weight stable Eyes:  No vision changes, No discharge HENT:  No sore throats, nasal drainage Lymph: No neck swelling, No bruising easily Pulmonary:  No cough, productive sputum CV: No orthopnea, PND  Patient walks 60 minutes for about 2 miles without difficulty.  No exertional chest/neck/shoulder/arm pain. GI:  No personal nor family history of GI/colon cancer, inflammatory bowel disease, irritable bowel syndrome, allergy such as Celiac Sprue, dietary/dairy problems, colitis, ulcers nor gastritis.  No recent sick  contacts/gastroenteritis.  No travel outside the country.  No changes in diet. Renal: No UTIs, No hematuria Genital:  No drainage, bleeding, masses Musculoskeletal: No severe joint pain.  Good ROM major joints Skin:  No sores or lesions.  No rashes Heme/Lymph:  No easy bleeding.  No swollen lymph nodes Neuro: No focal weakness/numbness.  No seizures Psych: No suicidal ideation.  No hallucinations  BP (!) 148/91 (BP Location: Left Arm)   Pulse 91   Temp 98 F (36.7 C) (Oral)   Resp 15   Ht 5\' 4"  (1.626 m)   Wt 80.7 kg   SpO2 95%   BMI 30.55 kg/m   Physical Exam: Constitutional: Patient and at least mild distress.  Uncomfortable.  Some rigors/shakes.  Not cachectic.  Hygeine adequate.  Lying still with feet up.  Not wanting to move.  Having some rigors/shakes.  Feels better after I gave him extra blankets.  Vitals signs as above.   Eyes: Pupils reactive, normal extraocular movements. Sclera nonicteric Neuro: CN II-XII intact.  No major focal sensory defects.  No major motor deficits. Lymph: No head/neck/groin lymphadenopathy Psych:  No severe agitation.  No severe anxiety.  Judgment & insight Adequate, Oriented x4, HENT: Normocephalic, Mucus membranes moist.  No thrush.   Neck: Supple, No tracheal deviation.  No obvious thyromegaly Chest: No pain to chest wall compression.  Good respiratory excursion.  No audible wheezing CV:  Pulses intact.  Regular rhythm.  No major extremity edema Abdomen:  Somewhat firm.  Mildly distended.  Tenderness at Right side of abdomen especially right lower quadrant with guarding.  Reproduction of pain with cough and bed shake.  Psoas sign.  Small umbilical hernia.  Reducible. No incarcerated hernias.  No hepatomegaly.  No splenomegaly Gen: Small inguinal hernia suspected on CAT scan but nothing major on physical exam.  No meatal purulence.  Otherwise normal external male genitalia.  no inguinal lymphadenopathy.   Ext: No obvious deformity or contracture no  significant edema.  No cyanosis Skin: No major subcutaneous nodules.  Warm and dry Musculoskeletal: Severe joint rigidity not present.  No obvious clubbing.  No digital petechiae.     Results:   Labs: Results for orders placed or performed during the hospital encounter of 07/12/19 (from the past 48 hour(s))  Lipase, blood     Status: None   Collection Time: 07/12/19  9:52 PM  Result Value Ref Range   Lipase 40 11 - 51 U/L    Comment: Performed at [facility], [address], [place], [place]  Comprehensive metabolic panel     Status: Abnormal  Collection Time: 07/12/19  9:52 PM  Result Value Ref Range   Sodium 137 135 - 145 mmol/L   Potassium 3.3 (L) 3.5 - 5.1 mmol/L   Chloride 105 98 - 111 mmol/L   CO2 25 22 - 32 mmol/L   Glucose, Bld 118 (H) 70 - 99 mg/dL    Comment: Glucose reference range applies only to samples taken after fasting for at least 8 hours.   BUN 12 6 - 20 mg/dL   Creatinine, Ser 1.61 (H) 0.61 - 1.24 mg/dL   Calcium 9.1 8.9 - 09.6 mg/dL   Total Protein 7.4 6.5 - 8.1 g/dL   Albumin 4.3 3.5 - 5.0 g/dL   AST 23 15 - 41 U/L   ALT 20 0 - 44 U/L   Alkaline Phosphatase 52 38 - 126 U/L   Total Bilirubin 1.0 0.3 - 1.2 mg/dL   GFR calc non Af Amer 59 (L) >60 mL/min   GFR calc Af Amer >60 >60 mL/min   Anion gap 7 5 - 15    Comment: Performed at [facility], [address], [place], [place] [address]  CBC     Status: Abnormal   Collection Time: 07/12/19  9:52 PM  Result Value Ref Range   WBC 16.2 (H) 4.0 - 10.5 K/uL   RBC 5.40 4.22 - 5.81 MIL/uL   Hemoglobin 15.8 13.0 - 17.0 g/dL   HCT 98.1 19.1 - 47.8 %   MCV 85.9 80.0 - 100.0 fL   MCH 29.3 26.0 - 34.0 pg   MCHC 34.1 30.0 - 36.0 g/dL   RDW 29.5 62.1 - 30.8 %   Platelets 290 150 - 400 K/uL   nRBC 0.0 0.0 - 0.2 %    Comment: Performed at [facility], [address] [place], [place] [address]  POC occult blood, ED Provider will collect      Status: Abnormal   Collection Time: 07/12/19 10:49 PM  Result Value Ref Range   Fecal Occult Bld POSITIVE (A) NEGATIVE    Imaging / Studies: CT ABDOMEN PELVIS W CONTRAST  Result Date: 07/12/2019 CLINICAL DATA:  Right groin pain. EXAM: CT ABDOMEN AND PELVIS WITH CONTRAST TECHNIQUE: Multidetector CT imaging of the abdomen and pelvis was performed using the standard protocol following bolus administration of intravenous contrast. CONTRAST:  OMNIPAQUE IOHEXOL 300 MG/ML  SOLN COMPARISON:  None. FINDINGS: Lower chest: The lung bases are clear. The heart size is normal. Hepatobiliary: The liver is normal. Normal gallbladder.There is no biliary ductal dilation. Pancreas: Normal contours without ductal dilatation. No peripancreatic fluid collection. Spleen: Unremarkable. Adrenals/Urinary Tract: --Adrenal glands: Unremarkable. --Right kidney/ureter: No hydronephrosis or radiopaque kidney stones. --Left kidney/ureter: No hydronephrosis or radiopaque kidney stones. --Urinary bladder: Unremarkable. Stomach/Bowel: --Stomach/Duodenum: No hiatal hernia or other gastric abnormality. Normal duodenal course and caliber. --Small bowel: Unremarkable. --Colon: Unremarkable. --Appendix: The appendix is located in the right lower quadrant and is dilated measuring up to approximately 1.4 cm (axial series 2, image 61). There are periappendiceal inflammatory changes. There is free fluid in the right lower quadrant. Vascular/Lymphatic: Normal course and caliber of the major abdominal vessels. --No retroperitoneal lymphadenopathy. --No mesenteric lymphadenopathy. --No pelvic or inguinal lymphadenopathy. Reproductive: Unremarkable Other: There is a small amount of free fluid in the patient's abdomen and pelvis. The abdominal wall is normal. Musculoskeletal. No acute displaced fractures. IMPRESSION: 1. Findings most consistent with acute uncomplicated appendicitis. 2. There is a small volume of reactive free fluid in the abdomen and  pelvis. Electronically Signed   By: Constance Holster M.D.   On: 07/12/2019 23:51    Medications / Allergies: per chart  Antibiotics: Anti-infectives (From admission, onward)   Start     Dose/Rate Route Frequency Ordered Stop   07/13/19 0000  cefTRIAXone (ROCEPHIN) 2 g in sodium chloride 0.9 % 100 mL IVPB     2 g 200 mL/hr over 30 Minutes Intravenous  Once 07/12/19 2358     07/13/19 0000  metroNIDAZOLE (FLAGYL) IVPB 500 mg     500 mg 100 mL/hr over 60 Minutes Intravenous  Once 07/12/19 2358          Note: Portions of this report may have been transcribed using voice recognition software. Every effort was made to ensure accuracy; however, inadvertent computerized transcription errors may be present.   Any transcriptional errors that result from this process are unintentional.    Adin Hector, MD, FACS, MASCRS Gastrointestinal and Minimally Invasive Surgery  [facility] [address] [place], [place] [address] [phone] Fax [phone] Main/Paging  CONTACT INFORMATION: Weekday (9AM-5PM) concerns: Call [facility] main office at [phone] Weeknight (5PM-9AM) or Weekend/Holiday concerns: Check [email or web address] for General Surgery [facility] coverage (Please, do not use SecureChat as it is not reliable communication to surgeons for patient care)     .      07/13/2019

## 2019-07-13 NOTE — Discharge Instructions (Signed)
[facility] ° °Please arrive at least 30 min before your appointment to complete your check in paperwork.  If you are unable to arrive 30 min prior to your appointment time we may have to cancel or reschedule you. °LAPAROSCOPIC SURGERY: POST OP INSTRUCTIONS °Always review your discharge instruction sheet given to you by the facility where your surgery was performed. °IF YOU HAVE DISABILITY OR FAMILY LEAVE FORMS, YOU MUST BRING THEM TO THE OFFICE FOR PROCESSING.   °DO NOT GIVE THEM TO YOUR DOCTOR. ° °PAIN CONTROL ° °1. First take acetaminophen (Tylenol) AND/or ibuprofen (Advil) to control your pain after surgery.  Follow directions on package.  Taking acetaminophen (Tylenol) and/or ibuprofen (Advil) regularly after surgery will help to control your pain and lower the amount of prescription pain medication you may need.  You should not take more than 4,000 mg (4 grams) of acetaminophen (Tylenol) in 24 hours.  You should not take ibuprofen (Advil), aleve, motrin, naprosyn or other NSAIDS if you have a history of stomach ulcers or chronic kidney disease.  °2. A prescription for pain medication may be given to you upon discharge.  Take your pain medication as prescribed, if you still have uncontrolled pain after taking acetaminophen (Tylenol) or ibuprofen (Advil). °3. Use ice packs to help control pain. °4. If you need a refill on your pain medication, please contact your pharmacy.  They will contact our office to request authorization. Prescriptions will not be filled after 5pm or on week-ends. ° °HOME MEDICATIONS °5. Take your usually prescribed medications unless otherwise directed. ° °DIET °6. You should follow a light diet the first few days after arrival home.  Be sure to include lots of fluids daily. Avoid fatty, fried foods.  ° °CONSTIPATION °7. It is common to experience some constipation after surgery and if you are taking pain medication.  Increasing fluid intake and taking a stool  softener (such as Colace) will usually help or prevent this problem from occurring.  A mild laxative (Milk of Magnesia or Miralax) should be taken according to package instructions if there are no bowel movements after 48 hours. ° °WOUND/INCISION CARE °8. Most patients will experience some swelling and bruising in the area of the incisions.  Ice packs will help.  Swelling and bruising can take several days to resolve.  °9. Unless discharge instructions indicate otherwise, follow guidelines below  °a. STERI-STRIPS - you may remove your outer bandages 48 hours after surgery, and you may shower at that time.  You have steri-strips (small skin tapes) in place directly over the incision.  These strips should be left on the skin for 7-10 days.   °b. DERMABOND/SKIN GLUE - you may shower in 24 hours.  The glue will flake off over the next 2-3 weeks. °10. Any sutures or staples will be removed at the office during your follow-up visit. ° °ACTIVITIES °11. You may resume regular (light) daily activities beginning the next day--such as daily self-care, walking, climbing stairs--gradually increasing activities as tolerated.  You may have sexual intercourse when it is comfortable.  Refrain from any heavy lifting or straining until approved by your doctor. °a. You may drive when you are no longer taking prescription pain medication, you can comfortably wear a seatbelt, and you can safely maneuver your car and apply brakes. ° °FOLLOW-UP °12. You should see your doctor in the office for a follow-up appointment approximately 2-3 weeks after your surgery.  You should have been given your post-op/follow-up appointment when   your surgery was scheduled.  If you did not receive a post-op/follow-up appointment, make sure that you call for this appointment within a day or two after you arrive home to insure a convenient appointment time. ° °OTHER INSTRUCTIONS ° °WHEN TO CALL YOUR DOCTOR: °1. Fever over 101.0 °2. Inability to  urinate °3. Continued bleeding from incision. °4. Increased pain, redness, or drainage from the incision. °5. Increasing abdominal pain ° °The clinic staff is available to answer your questions during regular business hours.  Please don’t hesitate to call and ask to speak to one of the nurses for clinical concerns.  If you have a medical emergency, go to the nearest emergency room or call 911.  A surgeon from [facility] is always on call at the hospital. °[address], [address], [place], [place]  [address] ? [address] [address], ,    [address] °[phone] ? [phone] ? FAX [phone] ° ° ° °

## 2019-07-13 NOTE — Transfer of Care (Signed)
Immediate Anesthesia Transfer of Care Note  Patient: Andre Green  Procedure(s) Performed: APPENDECTOMY LAPAROSCOPIC (N/A Abdomen)  Patient Location: PACU  Anesthesia Type:General  Level of Consciousness: sedated  Airway & Oxygen Therapy: Patient Spontanous Breathing and Patient connected to face mask oxygen  Post-op Assessment: Report given to RN and Post -op Vital signs reviewed and stable  Post vital signs: Reviewed and stable  Last Vitals:  Vitals Value Taken Time  BP    Temp    Pulse 72 07/13/19 1220  Resp 14 07/13/19 1220  SpO2 98 % 07/13/19 1220  Vitals shown include unvalidated device data.  Last Pain:  Vitals:   07/13/19 1059  TempSrc: Oral  PainSc:       Patients Stated Pain Goal: 3 (07/13/19 1054)  Complications: No apparent anesthesia complications

## 2019-07-13 NOTE — Anesthesia Procedure Notes (Signed)
Procedure Name: Intubation Date/Time: 07/13/2019 11:24 AM Performed by: Caren Macadam, CRNA Pre-anesthesia Checklist: Patient identified, Emergency Drugs available, Suction available and Patient being monitored Patient Re-evaluated:Patient Re-evaluated prior to induction Oxygen Delivery Method: Circle system utilized Preoxygenation: Pre-oxygenation with 100% oxygen Induction Type: IV induction Ventilation: Mask ventilation without difficulty Laryngoscope Size: Miller and 2 Grade View: Grade II Tube type: Oral Number of attempts: 2 Airway Equipment and Method: Stylet and Oral airway Placement Confirmation: ETT inserted through vocal cords under direct vision,  positive ETCO2 and breath sounds checked- equal and bilateral Secured at: 22 cm Tube secured with: Tape Dental Injury: Teeth and Oropharynx as per pre-operative assessment

## 2019-07-13 NOTE — Progress Notes (Signed)
[facility] Progress Note     Subjective: CC-  Continues to complain of abdominal pain, more on the right than the left although it is somewhat diffuse. Pain medication helps. OR later this morning.  Works in Mining engineer No significant PMH No PSH Nonsmoker, denies illicit drug or alcohol use  Objective: Vital signs in last 24 hours: Temp:  [98 F (36.7 C)-98.1 F (36.7 C)] 98.1 F (36.7 C) (05/03 0555) Pulse Rate:  [84-93] 87 (05/03 0555) Resp:  [15-18] 18 (05/03 0555) BP: (95-148)/(62-91) 95/62 (05/03 0555) SpO2:  [95 %-97 %] 96 % (05/03 0555) Weight:  [80.7 kg] 80.7 kg (05/02 2149) Last BM Date: 07/12/19  Intake/Output from previous day: 05/02 0701 - 05/03 0700 In: 1372.6 [I.V.:3; IV Piggyback:1369.6] Out: 152 [Urine:152] Intake/Output this shift: Total I/O In: -  Out: 200 [Urine:200]  PE: Gen:  Alert, NAD, pleasant Card:  RRR Pulm:  CTAB, no W/R/R, rate and effort normal Abd: Soft, mild distension, +BS, diffuse tenderness with guarding/ more tenderness R>L/ peritonitis Psych: A&Ox4  Skin: no rashes noted, warm and dry  Lab Results:  Recent Labs    07/12/19 2152  WBC 16.2*  HGB 15.8  HCT 46.4  PLT 290   BMET Recent Labs    07/12/19 2152  NA 137  K 3.3*  CL 105  CO2 25  GLUCOSE 118*  BUN 12  CREATININE 1.44*  CALCIUM 9.1   PT/INR No results for input(s): LABPROT, INR in the last 72 hours. CMP     Component Value Date/Time   NA 137 07/12/2019 2152   K 3.3 (L) 07/12/2019 2152   CL 105 07/12/2019 2152   CO2 25 07/12/2019 2152   GLUCOSE 118 (H) 07/12/2019 2152   BUN 12 07/12/2019 2152   CREATININE 1.44 (H) 07/12/2019 2152   CALCIUM 9.1 07/12/2019 2152   PROT 7.4 07/12/2019 2152   ALBUMIN 4.3 07/12/2019 2152   AST 23 07/12/2019 2152   ALT 20 07/12/2019 2152   ALKPHOS 52 07/12/2019 2152   BILITOT 1.0 07/12/2019 2152   GFRNONAA 59 (L) 07/12/2019 2152   GFRAA >60 07/12/2019 2152   Lipase     Component Value Date/Time    LIPASE 40 07/12/2019 2152       Studies/Results: CT ABDOMEN PELVIS W CONTRAST  Result Date: 07/12/2019 CLINICAL DATA:  Right groin pain. EXAM: CT ABDOMEN AND PELVIS WITH CONTRAST TECHNIQUE: Multidetector CT imaging of the abdomen and pelvis was performed using the standard protocol following bolus administration of intravenous contrast. CONTRAST:  182mL OMNIPAQUE IOHEXOL 300 MG/ML  SOLN COMPARISON:  None. FINDINGS: Lower chest: The lung bases are clear. The heart size is normal. Hepatobiliary: The liver is normal. Normal gallbladder.There is no biliary ductal dilation. Pancreas: Normal contours without ductal dilatation. No peripancreatic fluid collection. Spleen: Unremarkable. Adrenals/Urinary Tract: --Adrenal glands: Unremarkable. --Right kidney/ureter: No hydronephrosis or radiopaque kidney stones. --Left kidney/ureter: No hydronephrosis or radiopaque kidney stones. --Urinary bladder: Unremarkable. Stomach/Bowel: --Stomach/Duodenum: No hiatal hernia or other gastric abnormality. Normal duodenal course and caliber. --Small bowel: Unremarkable. --Colon: Unremarkable. --Appendix: The appendix is located in the right lower quadrant and is dilated measuring up to approximately 1.4 cm (axial series 2, image 61). There are periappendiceal inflammatory changes. There is free fluid in the right lower quadrant. Vascular/Lymphatic: Normal course and caliber of the major abdominal vessels. --No retroperitoneal lymphadenopathy. --No mesenteric lymphadenopathy. --No pelvic or inguinal lymphadenopathy. Reproductive: Unremarkable Other: There is a small amount of free fluid in the patient's abdomen and  pelvis. The abdominal wall is normal. Musculoskeletal. No acute displaced fractures. IMPRESSION: 1. Findings most consistent with acute uncomplicated appendicitis. 2. There is a small volume of reactive free fluid in the abdomen and pelvis. Electronically Signed   By: Katherine Mantle M.D.   On: 07/12/2019 23:51     Anti-infectives: Anti-infectives (From admission, onward)   Start     Dose/Rate Route Frequency Ordered Stop   07/13/19 0600  cefTRIAXone (ROCEPHIN) 2 g in sodium chloride 0.9 % 100 mL IVPB  Status:  Discontinued     2 g 200 mL/hr over 30 Minutes Intravenous On call to O.R. 07/13/19 0113 07/13/19 0124   07/13/19 0113  metroNIDAZOLE (FLAGYL) IVPB 500 mg  Status:  Discontinued     500 mg 100 mL/hr over 60 Minutes Intravenous Every 8 hours 07/13/19 0113 07/13/19 0124   07/13/19 0030  piperacillin-tazobactam (ZOSYN) IVPB 3.375 g     3.375 g 12.5 mL/hr over 240 Minutes Intravenous Every 8 hours 07/13/19 0014     07/13/19 0000  cefTRIAXone (ROCEPHIN) 2 g in sodium chloride 0.9 % 100 mL IVPB  Status:  Discontinued     2 g 200 mL/hr over 30 Minutes Intravenous  Once 07/12/19 2358 07/13/19 0014   07/13/19 0000  metroNIDAZOLE (FLAGYL) IVPB 500 mg  Status:  Discontinued     500 mg 100 mL/hr over 60 Minutes Intravenous  Once 07/12/19 2358 07/13/19 0014       Assessment/Plan Hypokalemia - K 3.3, replaced AKI - Cr 1.44, continue IVF  Acute appendicitis - OR later this morning. Continue IV zosyn, keep NPO.   ID - rocephin/flagyl 5/3, zosyn 5/3>> FEN - IVF, NPO VTE - SCDs, lovenox Foley - none Follow up - TBD   LOS: 0 days    Franne Forts, [facility] 07/13/2019, 8:37 AM Please see Amion for pager number during day hours 7:00am-4:30pm

## 2019-07-14 LAB — BASIC METABOLIC PANEL
Anion gap: 7 (ref 5–15)
BUN: 13 mg/dL (ref 6–20)
CO2: 25 mmol/L (ref 22–32)
Calcium: 8.8 mg/dL — ABNORMAL LOW (ref 8.9–10.3)
Chloride: 106 mmol/L (ref 98–111)
Creatinine, Ser: 1.43 mg/dL — ABNORMAL HIGH (ref 0.61–1.24)
GFR calc Af Amer: >60 mL/min (ref >60)
GFR calc non Af Amer: 60 mL/min — ABNORMAL LOW (ref >60)
Glucose, Bld: 126 mg/dL — ABNORMAL HIGH (ref 70–99)
Potassium: 3.9 mmol/L (ref 3.5–5.1)
Sodium: 138 mmol/L (ref 135–145)

## 2019-07-14 LAB — CBC
HCT: 35.9 % — ABNORMAL LOW (ref 39.0–52.0)
HCT: 39.2 % (ref 39.0–52.0)
Hemoglobin: 11.8 g/dL — ABNORMAL LOW (ref 13.0–17.0)
Hemoglobin: 12.7 g/dL — ABNORMAL LOW (ref 13.0–17.0)
MCH: 29.4 pg (ref 26.0–34.0)
MCH: 29 pg (ref 26.0–34.0)
MCHC: 32.9 g/dL (ref 30.0–36.0)
MCHC: 32.4 g/dL (ref 30.0–36.0)
MCV: 89.3 fL (ref 80.0–100.0)
MCV: 89.5 fL (ref 80.0–100.0)
Platelets: 211 10*3/uL (ref 150–400)
Platelets: 247 10*3/uL (ref 150–400)
RBC: 4.02 MIL/uL — ABNORMAL LOW (ref 4.22–5.81)
RBC: 4.38 MIL/uL (ref 4.22–5.81)
RDW: 13.1 % (ref 11.5–15.5)
RDW: 13.2 % (ref 11.5–15.5)
WBC: 13.9 10*3/uL — ABNORMAL HIGH (ref 4.0–10.5)
WBC: 15.4 10*3/uL — ABNORMAL HIGH (ref 4.0–10.5)
nRBC: 0 % (ref 0.0–0.2)
nRBC: 0 % (ref 0.0–0.2)

## 2019-07-14 LAB — MAGNESIUM: Magnesium: 1.9 mg/dL (ref 1.7–2.4)

## 2019-07-14 LAB — SURGICAL PATHOLOGY

## 2019-07-14 NOTE — Progress Notes (Signed)
[facility] Progress Note  1 Day Post-Op  Subjective: CC-  Already ambulated in the halls this morning. Abdomen sore but pain better than prior to surgery. He has only had tylenol for pain so far today. He does report some bloating, but denies nausea or vomiting. Tolerating clear liquids. Passing flatus and he has had 3 loose BMs today. WBC down 13.9, TMAX 99.6. Cr about the same 1.43, good UOP.  Objective: Vital signs in last 24 hours: Temp:  [97.6 F (36.4 C)-99.6 F (37.6 C)] 98.4 F (36.9 C) (05/04 0928) Pulse Rate:  [57-94] 68 (05/04 0928) Resp:  [14-21] 17 (05/04 0928) BP: (92-123)/(57-71) 103/60 (05/04 0928) SpO2:  [96 %-100 %] 100 % (05/04 0928) Weight:  [80.7 kg] 80.7 kg (05/03 1054) Last BM Date: 07/14/19  Intake/Output from previous day: 05/03 0701 - 05/04 0700 In: 4200.1 [P.O.:1257; I.V.:2843.1; IV Piggyback:100.1] Out: 1815 [Urine:1815] Intake/Output this shift: Total I/O In: 120 [P.O.:120] Out: -   PE: Gen:  Alert, NAD, pleasant HEENT: EOM's intact, pupils equal and round Card:  RRR Pulm:  CTAB, no W/R/R, rate and effort normal Abd: Soft, distended, appropriately tender, +BS, lap incisions C/D/I - ecchymosis noted around periumbilical incision  Lab Results:  Recent Labs    07/12/19 2152 07/14/19 0440  WBC 16.2* 13.9*  HGB 15.8 11.8*  HCT 46.4 35.9*  PLT 290 211   BMET Recent Labs    07/12/19 2152 07/14/19 0440  NA 137 138  K 3.3* 3.9  CL 105 106  CO2 25 25  GLUCOSE 118* 126*  BUN 12 13  CREATININE 1.44* 1.43*  CALCIUM 9.1 8.8*   PT/INR No results for input(s): LABPROT, INR in the last 72 hours. CMP     Component Value Date/Time   NA 138 07/14/2019 0440   K 3.9 07/14/2019 0440   CL 106 07/14/2019 0440   CO2 25 07/14/2019 0440   GLUCOSE 126 (H) 07/14/2019 0440   BUN 13 07/14/2019 0440   CREATININE 1.43 (H) 07/14/2019 0440   CALCIUM 8.8 (L) 07/14/2019 0440   PROT 7.4 07/12/2019 2152   ALBUMIN 4.3 07/12/2019 2152    AST 23 07/12/2019 2152   ALT 20 07/12/2019 2152   ALKPHOS 52 07/12/2019 2152   BILITOT 1.0 07/12/2019 2152   GFRNONAA 60 (L) 07/14/2019 0440   GFRAA >60 07/14/2019 0440   Lipase     Component Value Date/Time   LIPASE 40 07/12/2019 2152       Studies/Results: CT ABDOMEN PELVIS W CONTRAST  Result Date: 07/12/2019 CLINICAL DATA:  Right groin pain. EXAM: CT ABDOMEN AND PELVIS WITH CONTRAST TECHNIQUE: Multidetector CT imaging of the abdomen and pelvis was performed using the standard protocol following bolus administration of intravenous contrast. CONTRAST:  OMNIPAQUE IOHEXOL 300 MG/ML  SOLN COMPARISON:  None. FINDINGS: Lower chest: The lung bases are clear. The heart size is normal. Hepatobiliary: The liver is normal. Normal gallbladder.There is no biliary ductal dilation. Pancreas: Normal contours without ductal dilatation. No peripancreatic fluid collection. Spleen: Unremarkable. Adrenals/Urinary Tract: --Adrenal glands: Unremarkable. --Right kidney/ureter: No hydronephrosis or radiopaque kidney stones. --Left kidney/ureter: No hydronephrosis or radiopaque kidney stones. --Urinary bladder: Unremarkable. Stomach/Bowel: --Stomach/Duodenum: No hiatal hernia or other gastric abnormality. Normal duodenal course and caliber. --Small bowel: Unremarkable. --Colon: Unremarkable. --Appendix: The appendix is located in the right lower quadrant and is dilated measuring up to approximately 1.4 cm (axial series 2, image 61). There are periappendiceal inflammatory changes. There is free fluid in the right lower quadrant. Vascular/Lymphatic:  Normal course and caliber of the major abdominal vessels. --No retroperitoneal lymphadenopathy. --No mesenteric lymphadenopathy. --No pelvic or inguinal lymphadenopathy. Reproductive: Unremarkable Other: There is a small amount of free fluid in the patient's abdomen and pelvis. The abdominal wall is normal. Musculoskeletal. No acute displaced fractures. IMPRESSION: 1.  Findings most consistent with acute uncomplicated appendicitis. 2. There is a small volume of reactive free fluid in the abdomen and pelvis. Electronically Signed   By: Constance Holster M.D.   On: 07/12/2019 23:51    Anti-infectives: Anti-infectives (From admission, onward)   Start     Dose/Rate Route Frequency Ordered Stop   07/13/19 0600  cefTRIAXone (ROCEPHIN) 2 g in sodium chloride 0.9 % 100 mL IVPB  Status:  Discontinued     2 g 200 mL/hr over 30 Minutes Intravenous On call to O.R. 07/13/19 0113 07/13/19 0124   07/13/19 0113  metroNIDAZOLE (FLAGYL) IVPB 500 mg  Status:  Discontinued     500 mg 100 mL/hr over 60 Minutes Intravenous Every 8 hours 07/13/19 0113 07/13/19 0124   07/13/19 0030  piperacillin-tazobactam (ZOSYN) IVPB 3.375 g     3.375 g 12.5 mL/hr over 240 Minutes Intravenous Every 8 hours 07/13/19 0014     07/13/19 0000  cefTRIAXone (ROCEPHIN) 2 g in sodium chloride 0.9 % 100 mL IVPB  Status:  Discontinued     2 g 200 mL/hr over 30 Minutes Intravenous  Once 07/12/19 2358 07/13/19 0014   07/13/19 0000  metroNIDAZOLE (FLAGYL) IVPB 500 mg  Status:  Discontinued     500 mg 100 mL/hr over 60 Minutes Intravenous  Once 07/12/19 2358 07/13/19 0014       Assessment/Plan Hypokalemia - resolved AKI - Cr 1.43 from 1.44, good UOP, continue IVF and encourage PO intake. D/c toradol ABL anemia - Hgb 11.8 from 15.8, repeat CBC at 1500  Acute appendicitis with generalized peritonitis S/p laparoscopic appendectomy 5/3 Dr. Brantley Stage - POD#1 - Patient having bowel function and tolerating clears. Advance to full liquids, but would like his abdominal distension to improve prior to advancing past this. Continue mobilizing. Continue IV antibiotics today. He will need 10 total days abx postop.  ID - rocephin/flagyl 5/3 x1, zosyn 5/3>>day#2 FEN - IVF@100cc /hr, FLD VTE - SCDs, lovenox Foley - none Follow up - TBD   LOS: 1 day    Wellington Hampshire, [facility]  Surgery 07/14/2019, 9:49 AM Please see Amion for pager number during day hours 7:00am-4:30pm

## 2019-07-15 LAB — BASIC METABOLIC PANEL
Anion gap: 5 (ref 5–15)
BUN: 14 mg/dL (ref 6–20)
CO2: 28 mmol/L (ref 22–32)
Calcium: 9 mg/dL (ref 8.9–10.3)
Chloride: 108 mmol/L (ref 98–111)
Creatinine, Ser: 1.59 mg/dL — ABNORMAL HIGH (ref 0.61–1.24)
GFR calc Af Amer: >60 mL/min (ref >60)
GFR calc non Af Amer: 53 mL/min — ABNORMAL LOW (ref >60)
Glucose, Bld: 96 mg/dL (ref 70–99)
Potassium: 4.5 mmol/L (ref 3.5–5.1)
Sodium: 141 mmol/L (ref 135–145)

## 2019-07-15 LAB — CBC
HCT: 37.1 % — ABNORMAL LOW (ref 39.0–52.0)
Hemoglobin: 11.8 g/dL — ABNORMAL LOW (ref 13.0–17.0)
MCH: 28.9 pg (ref 26.0–34.0)
MCHC: 31.8 g/dL (ref 30.0–36.0)
MCV: 90.7 fL (ref 80.0–100.0)
Platelets: 232 10*3/uL (ref 150–400)
RBC: 4.09 MIL/uL — ABNORMAL LOW (ref 4.22–5.81)
RDW: 13.3 % (ref 11.5–15.5)
WBC: 11 10*3/uL — ABNORMAL HIGH (ref 4.0–10.5)
nRBC: 0 % (ref 0.0–0.2)

## 2019-07-15 LAB — MAGNESIUM: Magnesium: 2 mg/dL (ref 1.7–2.4)

## 2019-07-15 LAB — C DIFFICILE (CDIFF) QUICK SCRN (NO PCR REFLEX)
C Diff antigen: POSITIVE — AB
C Diff toxin: NEGATIVE

## 2019-07-15 MED ORDER — AMOXICILLIN-POT CLAVULANATE 875-125 MG PO TABS
1.0000 | ORAL_TABLET | Freq: Two times a day (BID) | ORAL | Status: DC
  Administered 2019-07-15 – 2019-07-18 (×7): 1 via ORAL
  Filled 2019-07-15 (×7): qty 1

## 2019-07-15 MED ORDER — METHOCARBAMOL 500 MG PO TABS
500.0000 mg | ORAL_TABLET | Freq: Four times a day (QID) | ORAL | Status: DC | PRN
  Administered 2019-07-16 – 2019-07-17 (×2): 500 mg via ORAL
  Filled 2019-07-15 (×3): qty 1

## 2019-07-15 MED ORDER — HYDROMORPHONE HCL 1 MG/ML IJ SOLN
1.0000 mg | INTRAMUSCULAR | Status: DC | PRN
  Administered 2019-07-15 – 2019-07-17 (×5): 1 mg via INTRAVENOUS
  Filled 2019-07-15 (×5): qty 1

## 2019-07-15 NOTE — Progress Notes (Signed)
[facility] Progress Note  2 Days Post-Op  Subjective: CC-  Overall feeling well. Still bloated but a little less. Tolerating full liquids without nausea/vomiting. Feels hungry. Passing flatus. He had at least 5 loose stools yesterday.  Cr up 1.59, good UOP.  WBC down 11, afebrile.  Objective: Vital signs in last 24 hours: Temp:  [97.6 F (36.4 C)-98.8 F (37.1 C)] 97.8 F (36.6 C) (05/05 0609) Pulse Rate:  [60-74] 73 (05/05 0609) Resp:  [16-20] 20 (05/05 0609) BP: (98-127)/(50-81) 115/81 (05/05 0609) SpO2:  [93 %-100 %] 99 % (05/05 0609) Last BM Date: 07/14/19  Intake/Output from previous day: 05/04 0701 - 05/05 0700 In: 2707.7 [P.O.:960; I.V.:1597.7; IV Piggyback:150] Out: 300 [Urine:300] Intake/Output this shift: No intake/output data recorded.  PE: Gen:  Alert, NAD, pleasant HEENT: EOM's intact, pupils equal and round Card:  RRR Pulm:  CTAB, no W/R/R, rate and effort normal Abd: Soft, distended (a little improved from yesterday), appropriately tender, +BS, lap incisions C/D/I - ecchymosis noted around periumbilical incision  Lab Results:  Recent Labs    07/14/19 1450 07/15/19 0425  WBC 15.4* 11.0*  HGB 12.7* 11.8*  HCT 39.2 37.1*  PLT 247 232   BMET Recent Labs    07/14/19 0440 07/15/19 0425  NA 138 141  K 3.9 4.5  CL 106 108  CO2 25 28  GLUCOSE 126* 96  BUN 13 14  CREATININE 1.43* 1.59*  CALCIUM 8.8* 9.0   PT/INR No results for input(s): LABPROT, INR in the last 72 hours. CMP     Component Value Date/Time   NA 141 07/15/2019 0425   K 4.5 07/15/2019 0425   CL 108 07/15/2019 0425   CO2 28 07/15/2019 0425   GLUCOSE 96 07/15/2019 0425   BUN 14 07/15/2019 0425   CREATININE 1.59 (H) 07/15/2019 0425   CALCIUM 9.0 07/15/2019 0425   PROT 7.4 07/12/2019 2152   ALBUMIN 4.3 07/12/2019 2152   AST 23 07/12/2019 2152   ALT 20 07/12/2019 2152   ALKPHOS 52 07/12/2019 2152   BILITOT 1.0 07/12/2019 2152   GFRNONAA 53 (L) 07/15/2019 0425    GFRAA >60 07/15/2019 0425   Lipase     Component Value Date/Time   LIPASE 40 07/12/2019 2152       Studies/Results: No results found.  Anti-infectives: Anti-infectives (From admission, onward)   Start     Dose/Rate Route Frequency Ordered Stop   07/13/19 0600  cefTRIAXone (ROCEPHIN) 2 g in sodium chloride 0.9 % 100 mL IVPB  Status:  Discontinued     2 g 200 mL/hr over 30 Minutes Intravenous On call to O.R. 07/13/19 0113 07/13/19 0124   07/13/19 0113  metroNIDAZOLE (FLAGYL) IVPB 500 mg  Status:  Discontinued     500 mg 100 mL/hr over 60 Minutes Intravenous Every 8 hours 07/13/19 0113 07/13/19 0124   07/13/19 0030  piperacillin-tazobactam (ZOSYN) IVPB 3.375 g     3.375 g 12.5 mL/hr over 240 Minutes Intravenous Every 8 hours 07/13/19 0014     07/13/19 0000  cefTRIAXone (ROCEPHIN) 2 g in sodium chloride 0.9 % 100 mL IVPB  Status:  Discontinued     2 g 200 mL/hr over 30 Minutes Intravenous  Once 07/12/19 2358 07/13/19 0014   07/13/19 0000  metroNIDAZOLE (FLAGYL) IVPB 500 mg  Status:  Discontinued     500 mg 100 mL/hr over 60 Minutes Intravenous  Once 07/12/19 2358 07/13/19 0014       Assessment/Plan Hypokalemia - resolved ABL anemia -  Hgb 11.8, stable AKI - Cr 1.59 from 1.43, good UOP, increase IVF to 125cc/hr and encourage PO intake. Unsure of patient's baseline but Cr has gone up some since yesterday; recheck BMP in AM, discussed with patient that he will need PCP follow up after discharge to recheck kidney function  Acute appendicitis with generalized peritonitis S/p laparoscopic appendectomy 5/3 Dr. Brantley Stage - POD#2 - Continues to have several loose stools. Check c diff since he has been on antibiotics. Transition to oral augmentin today. Advance to soft diet. May be ready for discharge tomorrow if kidney function improving.  ID -rocephin/flagyl 5/3 x1, zosyn 5/3>>5/5, augmentin 5/5>>day#1 (will need 10 total das antibiotics) FEN -IVF@125cc /hr, soft diet VTE -SCDs,  lovenox Foley -none Follow up - [place] [facility], PCP   LOS: 2 days    Wellington Hampshire, [facility] Surgery 07/15/2019, 9:22 AM Please see Amion for pager number during day hours 7:00am-4:30pm

## 2019-07-16 LAB — CBC
HCT: 38.6 % — ABNORMAL LOW (ref 39.0–52.0)
Hemoglobin: 12.4 g/dL — ABNORMAL LOW (ref 13.0–17.0)
MCH: 29.1 pg (ref 26.0–34.0)
MCHC: 32.1 g/dL (ref 30.0–36.0)
MCV: 90.6 fL (ref 80.0–100.0)
Platelets: 255 10*3/uL (ref 150–400)
RBC: 4.26 MIL/uL (ref 4.22–5.81)
RDW: 13.2 % (ref 11.5–15.5)
WBC: 8.4 10*3/uL (ref 4.0–10.5)
nRBC: 0 % (ref 0.0–0.2)

## 2019-07-16 LAB — BASIC METABOLIC PANEL
Anion gap: 8 (ref 5–15)
BUN: 12 mg/dL (ref 6–20)
CO2: 30 mmol/L (ref 22–32)
Calcium: 8.9 mg/dL (ref 8.9–10.3)
Chloride: 103 mmol/L (ref 98–111)
Creatinine, Ser: 1.59 mg/dL — ABNORMAL HIGH (ref 0.61–1.24)
GFR calc Af Amer: >60 mL/min (ref >60)
GFR calc non Af Amer: 53 mL/min — ABNORMAL LOW (ref >60)
Glucose, Bld: 97 mg/dL (ref 70–99)
Potassium: 3.8 mmol/L (ref 3.5–5.1)
Sodium: 141 mmol/L (ref 135–145)

## 2019-07-16 MED ORDER — SACCHAROMYCES BOULARDII 250 MG PO CAPS
250.0000 mg | ORAL_CAPSULE | Freq: Two times a day (BID) | ORAL | Status: DC
  Administered 2019-07-16 – 2019-07-18 (×5): 250 mg via ORAL
  Filled 2019-07-16 (×5): qty 1

## 2019-07-16 MED ORDER — VANCOMYCIN 50 MG/ML ORAL SOLUTION
125.0000 mg | Freq: Four times a day (QID) | ORAL | Status: DC
  Administered 2019-07-16 – 2019-07-18 (×9): 125 mg via ORAL
  Filled 2019-07-16 (×10): qty 2.5

## 2019-07-16 NOTE — Progress Notes (Signed)
[facility] Progress Note  3 Days Post-Op  Subjective: CC-  Still feels a little bloated but overall better. He only had about 3 loose stool yesterday. BM this morning still loose but states that it is not watery and starting to have some formed stool. Tolerating diet. Denies n/v. WBC normalized 8.4, afebrile. Cr the same at 1.59, good UOP.  Objective: Vital signs in last 24 hours: Temp:  [98.1 F (36.7 C)-98.6 F (37 C)] 98.1 F (36.7 C) (05/06 0619) Pulse Rate:  [58-70] 58 (05/06 0619) Resp:  [16-19] 19 (05/06 0619) BP: (123-154)/(65-94) 154/94 (05/06 0619) SpO2:  [97 %-99 %] 99 % (05/06 0619) Last BM Date: 07/15/19  Intake/Output from previous day: 05/05 0701 - 05/06 0700 In: 4156.4 [P.O.:1080; I.V.:2805.9; IV Piggyback:30.5] Out: 1750 [Urine:1750] Intake/Output this shift: No intake/output data recorded.  PE: Gen: Alert, NAD, pleasant Card: RRR Pulm: CTAB, no W/R/R, rate and effort normal Abd: Soft, mild distension, nontender, +BS,lapincisions C/D/I - ecchymosis noted around periumbilical incision   Lab Results:  Recent Labs    07/15/19 0425 07/16/19 0439  WBC 11.0* 8.4  HGB 11.8* 12.4*  HCT 37.1* 38.6*  PLT 232 255   BMET Recent Labs    07/15/19 0425 07/16/19 0439  NA 141 141  K 4.5 3.8  CL 108 103  CO2 28 30  GLUCOSE 96 97  BUN 14 12  CREATININE 1.59* 1.59*  CALCIUM 9.0 8.9   PT/INR No results for input(s): LABPROT, INR in the last 72 hours. CMP     Component Value Date/Time   NA 141 07/16/2019 0439   K 3.8 07/16/2019 0439   CL 103 07/16/2019 0439   CO2 30 07/16/2019 0439   GLUCOSE 97 07/16/2019 0439   BUN 12 07/16/2019 0439   CREATININE 1.59 (H) 07/16/2019 0439   CALCIUM 8.9 07/16/2019 0439   PROT 7.4 07/12/2019 2152   ALBUMIN 4.3 07/12/2019 2152   AST 23 07/12/2019 2152   ALT 20 07/12/2019 2152   ALKPHOS 52 07/12/2019 2152   BILITOT 1.0 07/12/2019 2152   GFRNONAA 53 (L) 07/16/2019 0439   GFRAA >60 07/16/2019 0439    Lipase     Component Value Date/Time   LIPASE 40 07/12/2019 2152       Studies/Results: No results found.  Anti-infectives: Anti-infectives (From admission, onward)   Start     Dose/Rate Route Frequency Ordered Stop   07/15/19 1400  amoxicillin-clavulanate (AUGMENTIN) 875-125 MG per tablet 1 tablet     1 tablet Oral Every 12 hours 07/15/19 0935     07/13/19 0600  cefTRIAXone (ROCEPHIN) 2 g in sodium chloride 0.9 % 100 mL IVPB  Status:  Discontinued     2 g 200 mL/hr over 30 Minutes Intravenous On call to O.R. 07/13/19 0113 07/13/19 0124   07/13/19 0113  metroNIDAZOLE (FLAGYL) IVPB 500 mg  Status:  Discontinued     500 mg 100 mL/hr over 60 Minutes Intravenous Every 8 hours 07/13/19 0113 07/13/19 0124   07/13/19 0030  piperacillin-tazobactam (ZOSYN) IVPB 3.375 g  Status:  Discontinued     3.375 g 12.5 mL/hr over 240 Minutes Intravenous Every 8 hours 07/13/19 0014 07/15/19 0935   07/13/19 0000  cefTRIAXone (ROCEPHIN) 2 g in sodium chloride 0.9 % 100 mL IVPB  Status:  Discontinued     2 g 200 mL/hr over 30 Minutes Intravenous  Once 07/12/19 2358 07/13/19 0014   07/13/19 0000  metroNIDAZOLE (FLAGYL) IVPB 500 mg  Status:  Discontinued  500 mg 100 mL/hr over 60 Minutes Intravenous  Once 07/12/19 2358 07/13/19 0014       Assessment/Plan Hypokalemia -resolved ABL anemia - Hgb 12.4 from 11.8, stable AKI - Crmay have plateaued at 1.59,good UOP, continue MIVF and encourage PO intake. This may be secondary to CT contrast and dehydration. Unsure of patient's baseline, will need PCP follow up after discharge to recheck kidney function. Patient does also take several supplements at home (animal byproduct protein, creatine...) Loose stool - c diff antigen + toxin -, do not need to treat  Acute appendicitis with generalized peritonitis S/p laparoscopic appendectomy 5/3 Dr. Luisa Hart - POD#3 - tolerating diet and having bowel function, loose stools improving. Add probiotic  ID  -rocephin/flagyl 5/3x1, zosyn 5/3>>5/5, augmentin 5/5>>day#2 (will need 10 total das antibiotics) FEN -IVF@125cc /hr,soft diet VTE -SCDs, lovenox Foley -none Follow up - [facility], PCP  Plan: WBC normalized, patient tolerating diet and having bowel function. Creatinine hopefully plateaued but will need to keep inpatient for IVF and to ensure Cr downtrends; will discuss with nephrology for any other recommendations.    LOS: 3 days    Franne Forts, [facility] 07/16/2019, 8:07 AM Please see Amion for pager number during day hours 7:00am-4:30pm

## 2019-07-17 ENCOUNTER — Inpatient Hospital Stay (HOSPITAL_COMMUNITY): Payer: BC Managed Care – PPO

## 2019-07-17 ENCOUNTER — Encounter (HOSPITAL_COMMUNITY): Payer: Self-pay

## 2019-07-17 LAB — BASIC METABOLIC PANEL
Anion gap: 9 (ref 5–15)
BUN: 14 mg/dL (ref 6–20)
CO2: 27 mmol/L (ref 22–32)
Calcium: 9.3 mg/dL (ref 8.9–10.3)
Chloride: 104 mmol/L (ref 98–111)
Creatinine, Ser: 1.33 mg/dL — ABNORMAL HIGH (ref 0.61–1.24)
GFR calc Af Amer: >60 mL/min (ref >60)
GFR calc non Af Amer: >60 mL/min (ref >60)
Glucose, Bld: 99 mg/dL (ref 70–99)
Potassium: 4 mmol/L (ref 3.5–5.1)
Sodium: 140 mmol/L (ref 135–145)

## 2019-07-17 LAB — CBC
HCT: 39.1 % (ref 39.0–52.0)
Hemoglobin: 12.7 g/dL — ABNORMAL LOW (ref 13.0–17.0)
MCH: 29 pg (ref 26.0–34.0)
MCHC: 32.5 g/dL (ref 30.0–36.0)
MCV: 89.3 fL (ref 80.0–100.0)
Platelets: 261 10*3/uL (ref 150–400)
RBC: 4.38 MIL/uL (ref 4.22–5.81)
RDW: 12.7 % (ref 11.5–15.5)
WBC: 7.5 10*3/uL (ref 4.0–10.5)
nRBC: 0 % (ref 0.0–0.2)

## 2019-07-17 MED ORDER — IOHEXOL 9 MG/ML PO SOLN
ORAL | Status: AC
  Filled 2019-07-17: qty 1000

## 2019-07-17 MED ORDER — IOHEXOL 9 MG/ML PO SOLN
500.0000 mL | ORAL | Status: AC
  Administered 2019-07-17 (×2): 500 mL via ORAL

## 2019-07-17 MED ORDER — SODIUM CHLORIDE (PF) 0.9 % IJ SOLN
INTRAMUSCULAR | Status: AC
  Filled 2019-07-17: qty 50

## 2019-07-17 NOTE — Progress Notes (Signed)
Repaged MD regarding diet order.

## 2019-07-17 NOTE — Progress Notes (Signed)
[facility] Progress Note  4 Days Post-Op  Subjective: CC-  States that he still feels bloated and is having crampy abdominal pain. Denies n/v. Tolerating diet. Still having loose stools but these have decreased in frequency.  WBC 7.5, afebrile. Cr trending down 1.33, good UOP.  Objective: Vital signs in last 24 hours: Temp:  [98.1 F (36.7 C)-98.7 F (37.1 C)] 98.1 F (36.7 C) (05/07 0557) Pulse Rate:  [45-58] 45 (05/07 0557) Resp:  [17-20] 17 (05/07 0557) BP: (113-122)/(71-74) 118/71 (05/07 0557) SpO2:  [90 %-100 %] 99 % (05/07 0557) Last BM Date: 07/15/19  Intake/Output from previous day: 05/06 0701 - 05/07 0700 In: 3849.9 [P.O.:1300; I.V.:2549.9] Out: 2701 [Urine:2700; Stool:1] Intake/Output this shift: Total I/O In: -  Out: 300 [Urine:300]  PE: Gen: Alert, NAD, pleasant Card: RRR Pulm: CTAB, no W/R/R, rate and effort normal Abd: Soft, mild distension, nontender, +BS,lapincisions C/D/I - ecchymosis noted around periumbilical incision    Lab Results:  Recent Labs    07/16/19 0439 07/17/19 0424  WBC 8.4 7.5  HGB 12.4* 12.7*  HCT 38.6* 39.1  PLT 255 261   BMET Recent Labs    07/16/19 0439 07/17/19 0424  NA 141 140  K 3.8 4.0  CL 103 104  CO2 30 27  GLUCOSE 97 99  BUN 12 14  CREATININE 1.59* 1.33*  CALCIUM 8.9 9.3   PT/INR No results for input(s): LABPROT, INR in the last 72 hours. CMP     Component Value Date/Time   NA 140 07/17/2019 0424   K 4.0 07/17/2019 0424   CL 104 07/17/2019 0424   CO2 27 07/17/2019 0424   GLUCOSE 99 07/17/2019 0424   BUN 14 07/17/2019 0424   CREATININE 1.33 (H) 07/17/2019 0424   CALCIUM 9.3 07/17/2019 0424   PROT 7.4 07/12/2019 2152   ALBUMIN 4.3 07/12/2019 2152   AST 23 07/12/2019 2152   ALT 20 07/12/2019 2152   ALKPHOS 52 07/12/2019 2152   BILITOT 1.0 07/12/2019 2152   GFRNONAA >60 07/17/2019 0424   GFRAA >60 07/17/2019 0424   Lipase     Component Value Date/Time   LIPASE 40 07/12/2019  2152       Studies/Results: No results found.  Anti-infectives: Anti-infectives (From admission, onward)   Start     Dose/Rate Route Frequency Ordered Stop   07/16/19 1000  vancomycin (VANCOCIN) 50 mg/mL oral solution 125 mg     125 mg Oral 4 times daily 07/16/19 0910 07/26/19 0959   07/15/19 1400  amoxicillin-clavulanate (AUGMENTIN) 875-125 MG per tablet 1 tablet     1 tablet Oral Every 12 hours 07/15/19 0935     07/13/19 0600  cefTRIAXone (ROCEPHIN) 2 g in sodium chloride 0.9 % 100 mL IVPB  Status:  Discontinued     2 g 200 mL/hr over 30 Minutes Intravenous On call to O.R. 07/13/19 0113 07/13/19 0124   07/13/19 0113  metroNIDAZOLE (FLAGYL) IVPB 500 mg  Status:  Discontinued     500 mg 100 mL/hr over 60 Minutes Intravenous Every 8 hours 07/13/19 0113 07/13/19 0124   07/13/19 0030  piperacillin-tazobactam (ZOSYN) IVPB 3.375 g  Status:  Discontinued     3.375 g 12.5 mL/hr over 240 Minutes Intravenous Every 8 hours 07/13/19 0014 07/15/19 0935   07/13/19 0000  cefTRIAXone (ROCEPHIN) 2 g in sodium chloride 0.9 % 100 mL IVPB  Status:  Discontinued     2 g 200 mL/hr over 30 Minutes Intravenous  Once 07/12/19 2358 07/13/19 0014  07/13/19 0000  metroNIDAZOLE (FLAGYL) IVPB 500 mg  Status:  Discontinued     500 mg 100 mL/hr over 60 Minutes Intravenous  Once 07/12/19 2358 07/13/19 0014       Assessment/Plan Hypokalemia -resolved ABL anemia - Hgb12.7, stable AKI - improving, Cr 1.33, continue IVF. Reviewed with nephrology, this may be secondary to CT contrast and dehydration, unsure of patient's baseline, will need PCP follow up after discharge to recheck kidney function. Patient does also take several supplements at home (animal byproduct protein, creatine...) C diff diarhea - c diff antigen + toxin -, since he is symptomatic PO vanc started 5/6  Acute appendicitis with generalized peritonitis S/p laparoscopic appendectomy 5/3 Dr. Brantley Stage - POD#4 - tolerating diet and having  bowel function but continues to feel bloated and have crampy abdominal pain  ID -rocephin/flagyl 5/3x1, zosyn 5/3>>5/5, augmentin 5/5>>day#3 (will need 10 total das antibiotics), PO vanco 5/6>> FEN -IVF@125cc /hr,soft diet VTE -SCDs, lovenox Foley -none Follow up -[facility], PCP  Plan: Check abdominal CT scan today. Will make him NPO for now.    LOS: 4 days    Wellington [place], [facility] 07/17/2019, 9:07 AM Please see Amion for pager number during day hours 7:00am-4:30pm

## 2019-07-17 NOTE — Progress Notes (Signed)
MD paged regarding diet order. No new orders.

## 2019-07-18 MED ORDER — VANCOMYCIN 50 MG/ML ORAL SOLUTION: 125.0000 mg | Freq: Four times a day (QID) | ORAL | 1 refills | Status: AC

## 2019-07-18 MED ORDER — SACCHAROMYCES BOULARDII 250 MG PO CAPS: 250.0000 mg | ORAL_CAPSULE | Freq: Two times a day (BID) | ORAL | 1 refills | Status: AC

## 2019-07-18 MED ORDER — HYDROCODONE-ACETAMINOPHEN 5-325 MG PO TABS: 1.0000 | ORAL_TABLET | Freq: Four times a day (QID) | ORAL | 0 refills | Status: AC | PRN

## 2019-07-18 MED FILL — VANCOMYCIN 50MG/ML ORAL SOL: 50MG/1ML | 14 days supply | Qty: 200 | Fill #0

## 2019-07-18 NOTE — Progress Notes (Signed)
5 Days Post-Op   Subjective/Chief Complaint: No complaints. Ready to go home   Objective: Vital signs in last 24 hours: Temp:  [97.2 F (36.2 C)-98.6 F (37 C)] 97.2 F (36.2 C) (05/08 0559) Pulse Rate:  [46-62] 46 (05/08 0559) Resp:  [18] 18 (05/08 0559) BP: (120-123)/(61-84) 123/84 (05/08 0559) SpO2:  [96 %-100 %] 98 % (05/08 0559) Last BM Date: 07/17/19  Intake/Output from previous day: 05/07 0701 - 05/08 0700 In: 3998.8 [P.O.:840; I.V.:3158.8] Out: 4270 [Urine:2425] Intake/Output this shift: No intake/output data recorded.  General appearance: alert and cooperative Resp: clear to auscultation bilaterally Cardio: regular rate and rhythm GI: soft, minimal tenderness. incisions look good with mild bruising  Lab Results:  Recent Labs    07/16/19 0439 07/17/19 0424  WBC 8.4 7.5  HGB 12.4* 12.7*  HCT 38.6* 39.1  PLT 255 261   BMET Recent Labs    07/16/19 0439 07/17/19 0424  NA 141 140  K 3.8 4.0  CL 103 104  CO2 30 27  GLUCOSE 97 99  BUN 12 14  CREATININE 1.59* 1.33*  CALCIUM 8.9 9.3   PT/INR No results for input(s): LABPROT, INR in the last 72 hours. ABG No results for input(s): PHART, HCO3 in the last 72 hours.  Invalid input(s): PCO2, PO2  Studies/Results: CT ABDOMEN PELVIS WO CONTRAST  Result Date: 07/17/2019 CLINICAL DATA:  Abdominal distension and pain status post appendectomy. EXAM: CT ABDOMEN AND PELVIS WITHOUT CONTRAST TECHNIQUE: Multidetector CT imaging of the abdomen and pelvis was performed following the standard protocol without IV contrast. COMPARISON:  Jul 12, 2019. FINDINGS: Lower chest: Small bilateral pleural effusions are noted. Hepatobiliary: No focal liver abnormality is seen. No gallstones, gallbladder wall thickening, or biliary dilatation. Pancreas: Unremarkable. No pancreatic ductal dilatation or surrounding inflammatory changes. Spleen: Normal in size without focal abnormality. Adrenals/Urinary Tract: Adrenal glands are  unremarkable. Kidneys are normal, without renal calculi, focal lesion, or hydronephrosis. Bladder is unremarkable. Stomach/Bowel: The stomach appears normal. There is no evidence of bowel obstruction. The patient is status post appendectomy with mild expected postoperative changes seen in the right lower quadrant. Vascular/Lymphatic: No significant vascular findings are present. No enlarged abdominal or pelvic lymph nodes. Reproductive: Prostate is unremarkable. Other: No abdominal wall hernia or abnormality. No abdominopelvic ascites. Musculoskeletal: No acute or significant osseous findings. IMPRESSION: 1. Small bilateral pleural effusions. 2. Status post appendectomy with mild expected postoperative changes seen in the right lower quadrant. 3. No other abnormality seen in the abdomen or pelvis. Electronically Signed   By: Marijo Conception M.D.   On: 07/17/2019 14:55    Anti-infectives: Anti-infectives (From admission, onward)   Start     Dose/Rate Route Frequency Ordered Stop   07/16/19 1000  vancomycin (VANCOCIN) 50 mg/mL oral solution 125 mg     125 mg Oral 4 times daily 07/16/19 0910 07/26/19 0959   07/15/19 1400  amoxicillin-clavulanate (AUGMENTIN) 875-125 MG per tablet 1 tablet     1 tablet Oral Every 12 hours 07/15/19 0935     07/13/19 0600  cefTRIAXone (ROCEPHIN) 2 g in sodium chloride 0.9 % 100 mL IVPB  Status:  Discontinued     2 g 200 mL/hr over 30 Minutes Intravenous On call to O.R. 07/13/19 0113 07/13/19 0124   07/13/19 0113  metroNIDAZOLE (FLAGYL) IVPB 500 mg  Status:  Discontinued     500 mg 100 mL/hr over 60 Minutes Intravenous Every 8 hours 07/13/19 0113 07/13/19 0124   07/13/19 0030  piperacillin-tazobactam (ZOSYN) IVPB  3.375 g  Status:  Discontinued     3.375 g 12.5 mL/hr over 240 Minutes Intravenous Every 8 hours 07/13/19 0014 07/15/19 0935   07/13/19 0000  cefTRIAXone (ROCEPHIN) 2 g in sodium chloride 0.9 % 100 mL IVPB  Status:  Discontinued     2 g 200 mL/hr over 30  Minutes Intravenous  Once 07/12/19 2358 07/13/19 0014   07/13/19 0000  metroNIDAZOLE (FLAGYL) IVPB 500 mg  Status:  Discontinued     500 mg 100 mL/hr over 60 Minutes Intravenous  Once 07/12/19 2358 07/13/19 0014      Assessment/Plan: s/p Procedure(s): APPENDECTOMY LAPAROSCOPIC (N/A) Advance diet Discharge  LOS: 5 days    Andre Green 07/18/2019

## 2019-07-18 NOTE — Plan of Care (Signed)
  Problem: Clinical Measurements: Goal: Cardiovascular complication will be avoided Outcome: Completed/Met   Problem: Nutrition: Goal: Adequate nutrition will be maintained Outcome: Completed/Met   ProbleGoal: Level of anxiety will decrease Outcome: Completed/Met   Problem: Elimination: Goal: Will not experience complications related to bowel motility Outcome: Completed/Met Goal: Will not experience complications related to urinary retention Outcome: Completed/Met   Problem: Pain Managment: Goal: General experience of comfort will improve Outcome: Completed/Met   Problem: Safety: Goal: Ability to remain free from injury will improve Outcome: Completed/Met   Problem: Skin Integrity: Goal: Risk for impaired skin integrity will decrease Outcome: Completed/Met

## 2019-07-18 NOTE — Progress Notes (Signed)
Patient d/c home with wife today. No pain meds given during my shift. Pt was ambulating, having bowel movements and tolerating diet.

## 2019-07-18 NOTE — Progress Notes (Signed)
Patient ID: Andre Green, male   DOB: December 16, 1975, 44 y.o.   MRN: [id number]   I sent in antibiotics through our office EMR for his antibiotics

## 2019-07-21 NOTE — Discharge Summary (Signed)
Physician Discharge Summary  Patient ID: Andre Green MRN: [id number] DOB/AGE: Jan 17, 1976 44 y.o.  Admit date: 07/12/2019 Discharge date: 07/18/2019  Admission Diagnoses:  Supprative appendicitis Hypokalemia AKI Anxiety Obesity Gout Hx COVID-19  Discharge Diagnoses:  Acute appendicitis with generalized peritonitis C. difficile colitis diarrhea Acute kidney injury -resolved Hypokalemia -resolved Anemia   Principal Problem:   Suppurative appendicitis Active Problems:   Hypokalemia   Creatinine elevation   Anxiety   Obesity (BMI 30-39.9)   Gout   Difficult or painful urination   History of [id number]   Umbilical hernia   PROCEDURES:LAPAROSCOPIC APPENDECTOMY, 07/13/19, Dr. Marcello Moores [facility] Course: Chief complaint / Reason for evaluation: Abdominal pain.  Dysuria.  Probable appendicitis  Gentleman mostly healthy.  Some anxiety.  Woke up last night with abdominal pain and discomfort.  Difficult to strain to urinate.  Work gradually worsened.  Pain radiating down to his penis.  More right than left-sided.  Wondered if he had a kidney stone.  Called his sister-in-law who is a Engineer, drilling in [place].  Worsening pain.  Finally decided come the emergency room given worsening concerns and getting chills/rigors.  Examination more diffuse.  Mildly elevated creatinine.  Elevated white count.  CT scan shows inflamed appendix with periappendiceal fluid tracking on right side of abdomen and bladder.  Suspicious for appendicitis.  Surgical consultation requested.  No personal nor family history of GI/colon cancer, inflammatory bowel disease, allergy such as Celiac Sprue, dietary/dairy problems, colitis, ulcers nor gastritis.  No recent sick contacts/gastroenteritis.  No travel outside the country.  No changes in diet.  No dysphagia to solids or liquids.  No significant heartburn or reflux.  No hematochezia, hematemesis, coffee ground emesis.  No evidence of prior gastric/peptic ulceration.   Occasionally has irregular bowels a few times a year but has not been formally diagnosed with irritable bowel.  No abdominal surgeries.  Definitely difficulty with urinating with burning sensation and discomfort at tip of penis but no history of UTIs.  Pt was admitted by Dr. Johney Maine and taken to the OR the following AM.   Appendix was perforated with significant pus, but no abscess.  Post op he had an elevation of his creatinine and diarrhea.  He was maintained on IV antibiotics.  C diff was positive for antigen and he was treated for C diff colitis with PO vancomycin.  His creatinine slowly improved, his WBC improved, and was discharged by Dr.Toth on 07/18/19.  He was into muscle supplements at home and we recommended he defer on these until he was seen by his PCP.   CBC Latest Ref Rng & Units 07/17/2019 07/16/2019 07/15/2019  WBC 4.0 - 10.5 K/uL 7.5 8.4 11.0(H)  Hemoglobin 13.0 - 17.0 g/dL 12.7(L) 12.4(L) 11.8(L)  Hematocrit 39.0 - 52.0 % 39.1 38.6(L) 37.1(L)  Platelets 150 - 400 K/uL 261 255 232    CMP Latest Ref Rng & Units 07/17/2019 07/16/2019 07/15/2019  Glucose 70 - 99 mg/dL 99 97 96  BUN 6 - 20 mg/dL 14 12 14   Creatinine 0.61 - 1.24 mg/dL 1.33(H) 1.59(H) 1.59(H)  Sodium 135 - 145 mmol/L 140 141 141  Potassium 3.5 - 5.1 mmol/L 4.0 3.8 4.5  Chloride 98 - 111 mmol/L 104 103 108  CO2 22 - 32 mmol/L 27 30 28   Calcium 8.9 - 10.3 mg/dL 9.3 8.9 9.0  Total Protein 6.5 - 8.1 g/dL - - -  Total Bilirubin 0.3 - 1.2 mg/dL - - -  Alkaline Phos 38 - 126 U/L - - -  AST 15 - 41 U/L - - -  ALT 0 - 44 U/L - - -     I did not see this patient and dictation is from the Chart notes.    Disposition: Discharge disposition: 01-Home or Self Care       Discharge Instructions    Call MD for:  difficulty breathing, headache or visual disturbances   Complete by: As directed    Call MD for:  extreme fatigue   Complete by: As directed    Call MD for:  hives   Complete by: As directed    Call MD for:   persistant dizziness or light-headedness   Complete by: As directed    Call MD for:  persistant nausea and vomiting   Complete by: As directed    Call MD for:  redness, tenderness, or signs of infection (pain, swelling, redness, odor or green/yellow discharge around incision site)   Complete by: As directed    Call MD for:  severe uncontrolled pain   Complete by: As directed    Call MD for:  temperature >100.4   Complete by: As directed    Diet - low sodium heart healthy   Complete by: As directed    Discharge instructions   Complete by: As directed    May shower. No heavy lifting. Diet as tolerated   Increase activity slowly   Complete by: As directed    No wound care   Complete by: As directed      Allergies as of 07/18/2019      Reactions   Mixed Grasses Anaphylaxis      Medication List    TAKE these medications   HYDROcodone-acetaminophen 5-325 MG tablet Commonly known as: NORCO/VICODIN Take 1-2 tablets by mouth every 6 (six) hours as needed for moderate pain or severe pain.   ibuprofen 200 MG tablet Commonly known as: ADVIL Take 200-600 mg by mouth every 6 (six) hours as needed for headache or moderate pain.   saccharomyces boulardii 250 MG capsule Commonly known as: FLORASTOR Take 1 capsule (250 mg total) by mouth 2 (two) times daily.   vancomycin 50 mg/mL  oral solution Commonly known as: VANCOCIN Take 2.5 mLs (125 mg total) by mouth 4 (four) times daily.      Follow-up Information    [facility], [place]. Go on 08/04/2019.   Specialty: General Surgery Why: Your appointment is 08/04/19 at 9 am Please arrive 30 minutes prior to your appointment to check in and fill out paperwork. Bring photo ID and insurance information. Contact information: [address] [place] [address] [phone]       Primary care physician Follow up.   Why: Call your PCP to arrange follow up about 1-2 weeks after surgery to recheck  blood work (kidney function)          Signed: Sherrie George 07/21/2019, 10:23 AM

## 2021-01-18 IMAGING — CT CT ABD-PELV W/O CM
2 of 4 series · 17 of 46 positions shown, 19 images · non-contrast
Comparison: July 12, 2019.

CLINICAL DATA: Abdominal distension and pain status post
appendectomy.

EXAM:
CT ABDOMEN AND PELVIS WITHOUT CONTRAST
TECHNIQUE: Multidetector CT imaging of the abdomen and pelvis was performed
following the standard protocol without IV contrast.

[Series 2: axial st · axial · 0.70mm/px · z∈[-771,-351]mm · 14 of 96 slices shown, 16 images]
[im 6/96  soft-tissue]
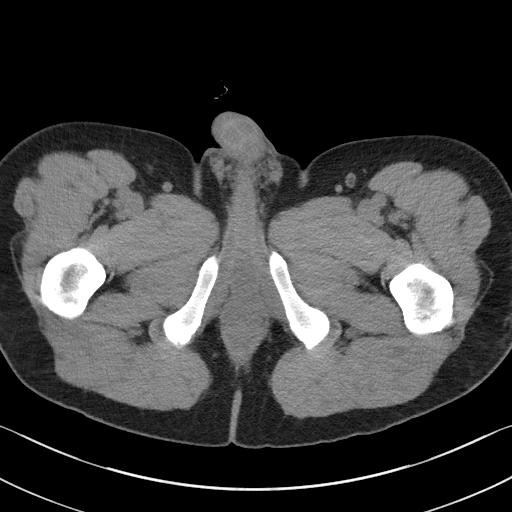
[im 6/96  bone]
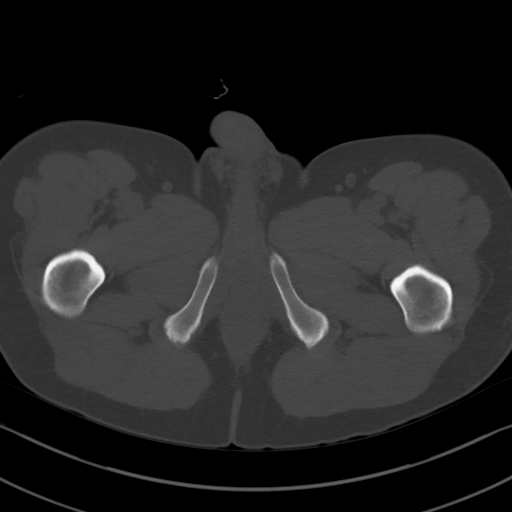
[im 11/96  soft-tissue]
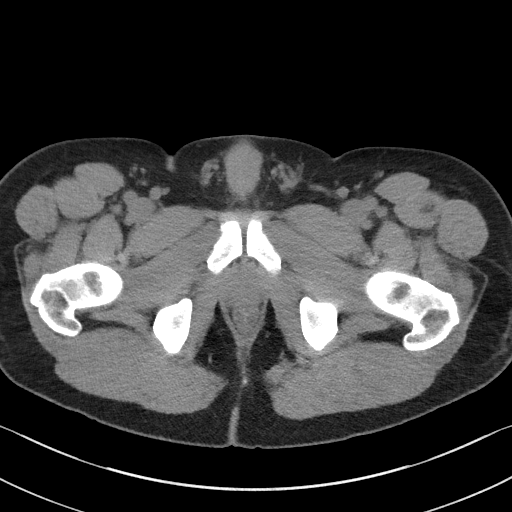
[im 22/96  soft-tissue]
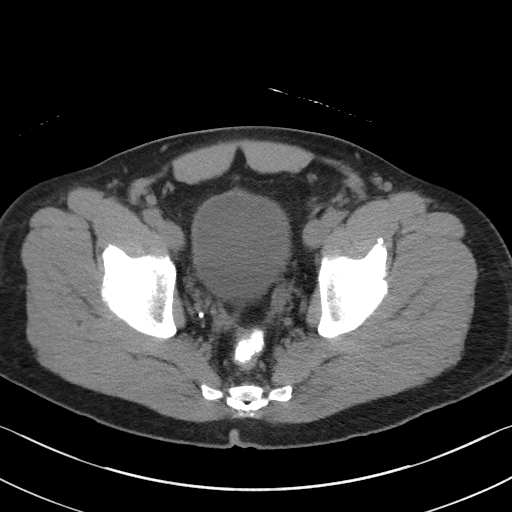
[im 27/96  soft-tissue]
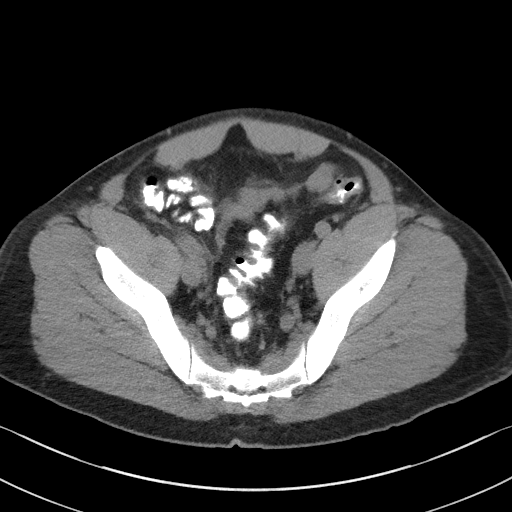
[im 32/96  soft-tissue]
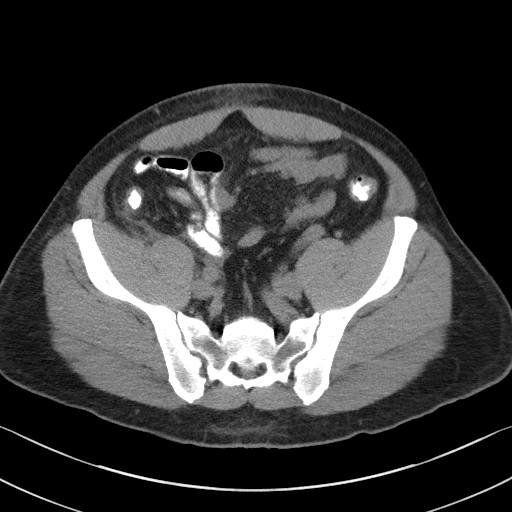
[im 37/96  soft-tissue]
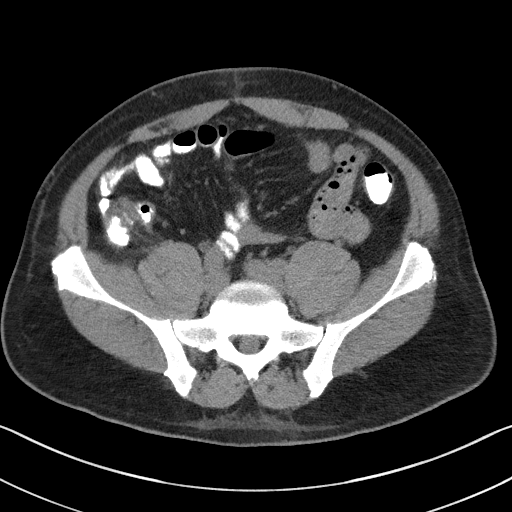
[im 43/96  soft-tissue]
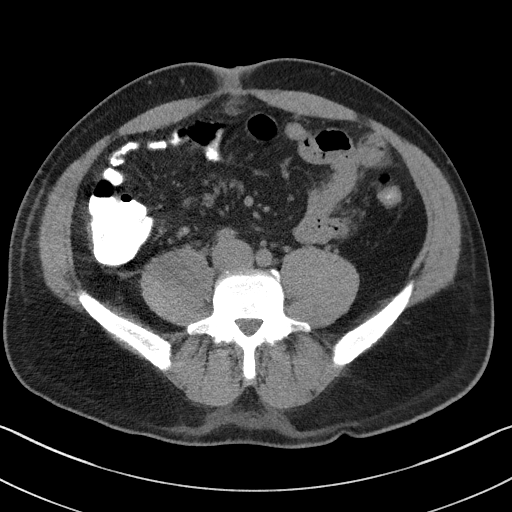
[im 53/96  soft-tissue]
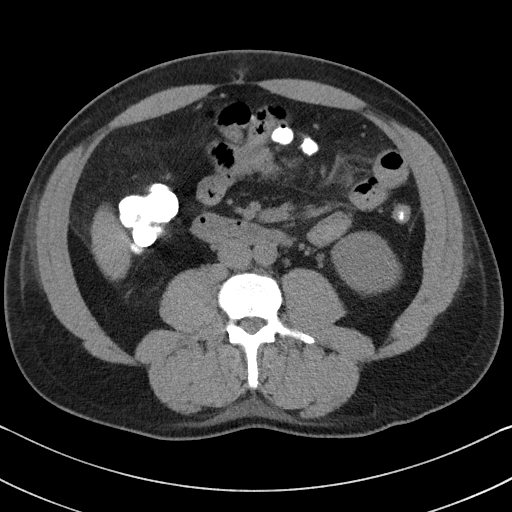
[im 59/96  soft-tissue]
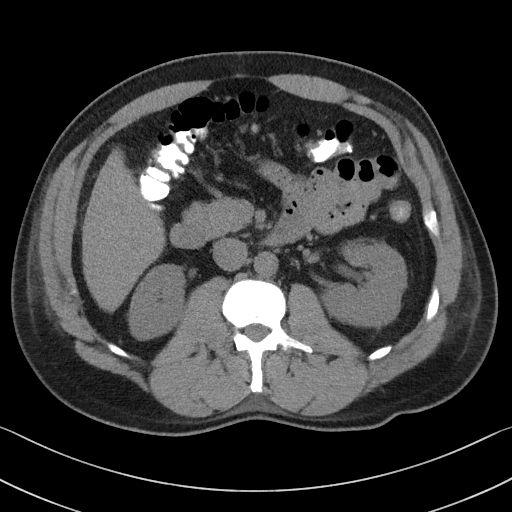
[im 59/96  bone]
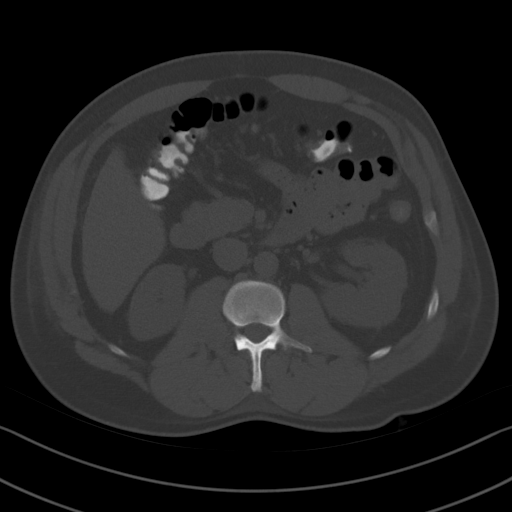
[im 64/96  soft-tissue]
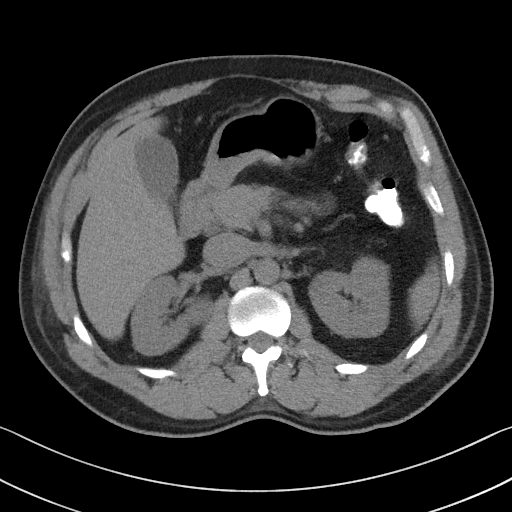
[im 69/96  soft-tissue]
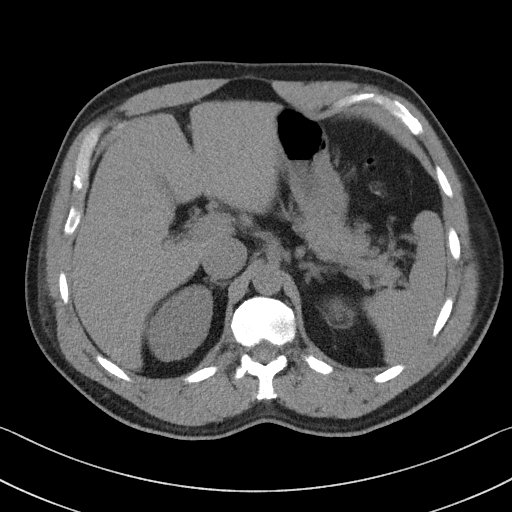
[im 74/96  soft-tissue]
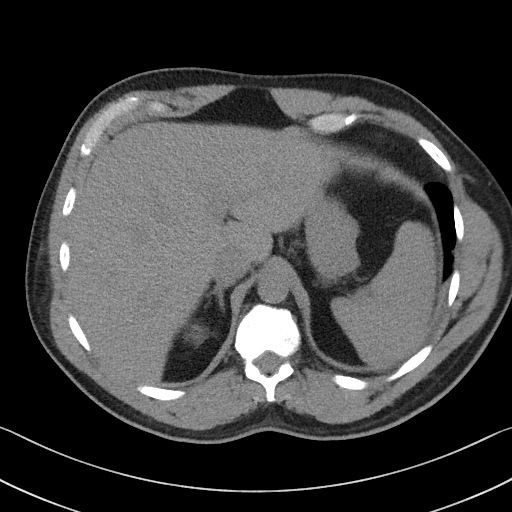
[im 85/96  soft-tissue]
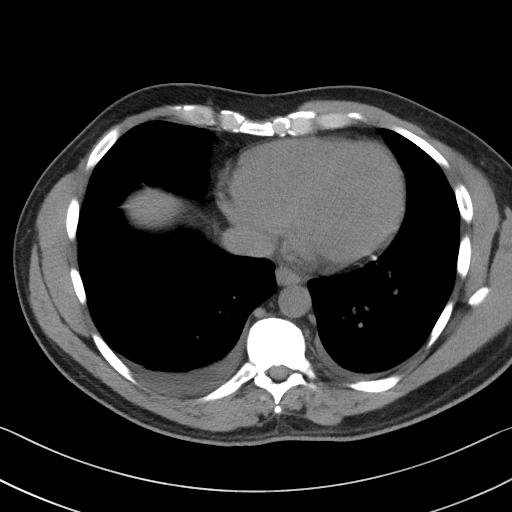
[im 90/96  soft-tissue]
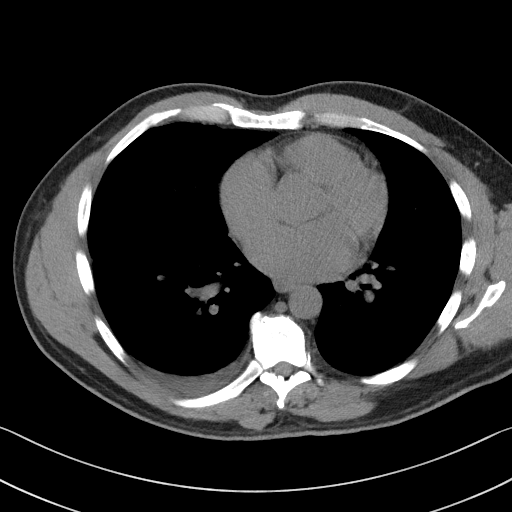

[Series 4: coronal st · coronal · 0.96mm/px · 3 of 102 slices shown]
[im 34/102  soft-tissue]
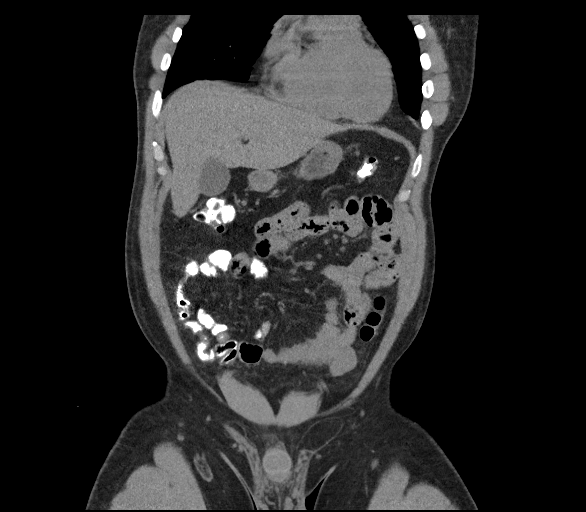
[im 45/102  soft-tissue]
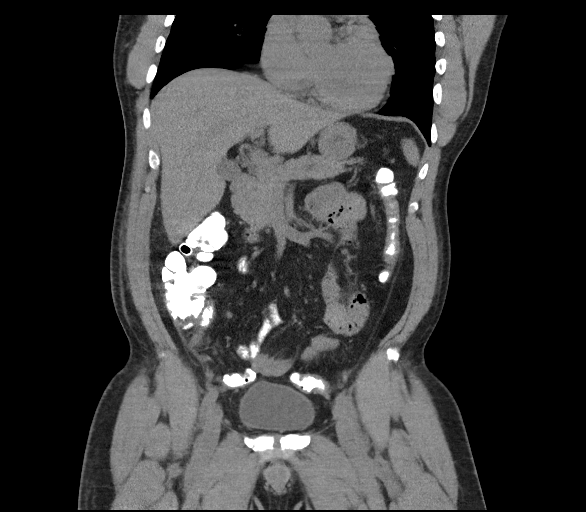
[im 57/102  soft-tissue]
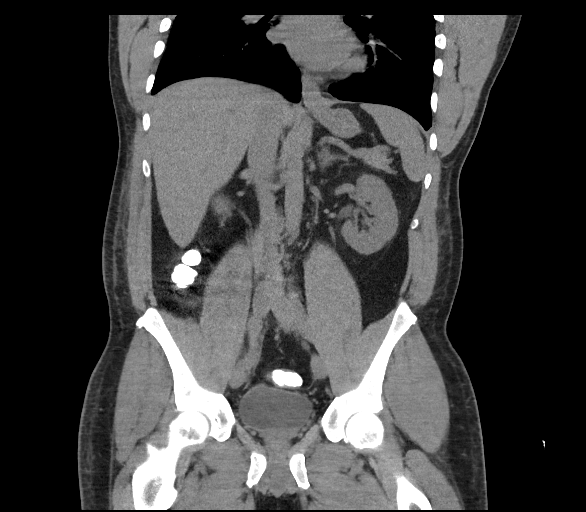

[17 of 46 positions shown; findings below may reference images not displayed]

FINDINGS: Lower chest: Small bilateral pleural effusions are noted.

Hepatobiliary: No focal liver abnormality is seen. No gallstones,
gallbladder wall thickening, or biliary dilatation.

Pancreas: Unremarkable. No pancreatic ductal dilatation or
surrounding inflammatory changes.

Spleen: Normal in size without focal abnormality.

Adrenals/Urinary Tract: Adrenal glands are unremarkable. Kidneys are
normal, without renal calculi, focal lesion, or hydronephrosis.
Bladder is unremarkable.

Stomach/Bowel: The stomach appears normal. There is no evidence of
bowel obstruction. The patient is status post appendectomy with mild
expected postoperative changes seen in the right lower quadrant.

Vascular/Lymphatic: No significant vascular findings are present. No
enlarged abdominal or pelvic lymph nodes.

Reproductive: Prostate is unremarkable.

Other: No abdominal wall hernia or abnormality. No abdominopelvic
ascites.

Musculoskeletal: No acute or significant osseous findings.
IMPRESSION: 1. Small bilateral pleural effusions.
2. Status post appendectomy with mild expected postoperative changes
seen in the right lower quadrant.
3. No other abnormality seen in the abdomen or pelvis.
